# Patient Record
Sex: Female | Born: 1975 | Race: White | Hispanic: No | Marital: Married | Smoking: Former smoker
Health system: Southern US, Community
[De-identification: ages and names within clinical notes are randomized; demographics above are authoritative.]

## PROBLEM LIST (undated history)

## (undated) DIAGNOSIS — K219 Gastro-esophageal reflux disease without esophagitis: Secondary | ICD-10-CM

## (undated) DIAGNOSIS — F988 Other specified behavioral and emotional disorders with onset usually occurring in childhood and adolescence: Secondary | ICD-10-CM

## (undated) DIAGNOSIS — E079 Disorder of thyroid, unspecified: Secondary | ICD-10-CM

## (undated) HISTORY — PX: ABDOMINAL HYSTERECTOMY: SHX81

## (undated) HISTORY — DX: Disorder of thyroid, unspecified: E07.9

## (undated) HISTORY — DX: Other specified behavioral and emotional disorders with onset usually occurring in childhood and adolescence: F98.8

---
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
## 2010-09-26 LAB — HM PAP SMEAR

## 2013-01-29 ENCOUNTER — Ambulatory Visit (INDEPENDENT_AMBULATORY_CARE_PROVIDER_SITE_OTHER): Payer: BC Managed Care – PPO | Admitting: Family Medicine

## 2013-01-29 ENCOUNTER — Encounter: Payer: Self-pay | Admitting: Family Medicine

## 2013-01-29 VITALS — BP 108/62 | HR 96 | Temp 98.6°F | Ht 65.0 in | Wt 166.8 lb

## 2013-01-29 DIAGNOSIS — F988 Other specified behavioral and emotional disorders with onset usually occurring in childhood and adolescence: Secondary | ICD-10-CM

## 2013-01-29 MED ORDER — LISDEXAMFETAMINE DIMESYLATE 40 MG PO CAPS: 40.0000 mg | ORAL_CAPSULE | ORAL | Status: DC

## 2013-01-29 NOTE — Progress Notes (Signed)
  Subjective:    Patient ID: Nicole Perry, female    DOB: 07-28-1976, 37 y.o.   MRN: [id number]  HPI Pt here to establish and start back on her ADD.  She had been on Vyvanse 30 mg stopped it because her mother was dx with MS and her GF because sick and passed away.  She started back to school and started vyvanse again but has transferred here.      Review of Systems As above    Past Medical History  Diagnosis Date  . ADD (attention deficit disorder)    History   Social History  . Marital Status: Married    Spouse Name: N/A    Number of Children: N/A  . Years of Education: N/A   Occupational History  . Not on file.   Social History Main Topics  . Smoking status: Never Smoker   . Smokeless tobacco: Never Used  . Alcohol Use: Yes     Comment: Socially  . Drug Use: No  . Sexually Active: Not on file   Other Topics Concern  . Not on file   Social History Narrative  . No narrative on file   Past Surgical History  Procedure Laterality Date  . Appendectomy  1989   No current outpatient prescriptions on file prior to visit.   No current facility-administered medications on file prior to visit.    Objective:   Physical Exam  BP 108/62  Pulse 96  Temp(Src) 98.6 F (37 C) (Oral)  Ht 5\' 5"  (1.651 m)  Wt 166 lb 12.8 oz (75.66 kg)  BMI 27.76 kg/m2  SpO2 96% General appearance: alert, cooperative, appears stated age and no distress Lungs: clear to auscultation bilaterally Heart: S1, S2 normal Extremities: extremities normal, atraumatic, no cyanosis or edema       Assessment & Plan:

## 2013-01-29 NOTE — Assessment & Plan Note (Signed)
vyvanse 40 mg 1 po qd  rto 1 month

## 2013-01-29 NOTE — Patient Instructions (Signed)
Attention Deficit Hyperactivity Disorder Attention deficit hyperactivity disorder (ADHD) is a problem with behavior issues based on the way the brain functions (neurobehavioral disorder). It is a common reason for behavior and academic problems in school. CAUSES  The cause of ADHD is unknown in most cases. It may run in families. It sometimes can be associated with learning disabilities and other behavioral problems. SYMPTOMS  There are 3 types of ADHD. The 3 types and some of the symptoms include:  Inattentive  Gets bored or distracted easily.  Loses or forgets things. Forgets to hand in homework.  Has trouble organizing or completing tasks.  Difficulty staying on task.  An inability to organize daily tasks and school work.  Leaving projects, chores, or homework unfinished.  Trouble paying attention or responding to details. Careless mistakes.  Difficulty following directions. Often seems like is not listening.  Dislikes activities that require sustained attention (like chores or homework).  Hyperactive-impulsive  Feels like it is impossible to sit still or stay in a seat. Fidgeting with hands and feet.  Trouble waiting turn.  Talking too much or out of turn. Interruptive.  Speaks or acts impulsively.  Aggressive, disruptive behavior.  Constantly busy or on the go, noisy.  Combined  Has symptoms of both of the above. Often children with ADHD feel discouraged about themselves and with school. They often perform well below their abilities in school. These symptoms can cause problems in home, school, and in relationships with peers. As children get older, the excess motor activities can calm down, but the problems with paying attention and staying organized persist. Most children do not outgrow ADHD but with good treatment can learn to cope with the symptoms. DIAGNOSIS  When ADHD is suspected, the diagnosis should be made by professionals trained in ADHD.  Diagnosis will  include:  Ruling out other reasons for the child's behavior.  The caregivers will check with the child's school and check their medical records.  They will talk to teachers and parents.  Behavior rating scales for the child will be filled out by those dealing with the child on a daily basis. A diagnosis is made only after all information has been considered. TREATMENT  Treatment usually includes behavioral treatment often along with medicines. It may include stimulant medicines. The stimulant medicines decrease impulsivity and hyperactivity and increase attention. Other medicines used include antidepressants and certain blood pressure medicines. Most experts agree that treatment for ADHD should address all aspects of the child's functioning. Treatment should not be limited to the use of medicines alone. Treatment should include structured classroom management. The parents must receive education to address rewarding good behavior, discipline, and limit-setting. Tutoring or behavioral therapy or both should be available for the child. If untreated, the disorder can have long-term serious effects into adolescence and adulthood. HOME CARE INSTRUCTIONS   Often with ADHD there is a lot of frustration among the family in dealing with the illness. There is often blame and anger that is not warranted. This is a life long illness. There is no way to prevent ADHD. In many cases, because the problem affects the family as a whole, the entire family may need help. A therapist can help the family find better ways to handle the disruptive behaviors and promote change. If the child is young, most of the therapist's work is with the parents. Parents will learn techniques for coping with and improving their child's behavior. Sometimes only the child with the ADHD needs counseling. Your caregivers can help   you make these decisions.  Children with ADHD may need help in organizing. Some helpful tips include:  Keep  routines the same every day from wake-up time to bedtime. Schedule everything. This includes homework and playtime. This should include outdoor and indoor recreation. Keep the schedule on the refrigerator or a bulletin board where it is frequently seen. Mark schedule changes as far in advance as possible.  Have a place for everything and keep everything in its place. This includes clothing, backpacks, and school supplies.  Encourage writing down assignments and bringing home needed books.  Offer your child a well-balanced diet. Breakfast is especially important for school performance. Children should avoid drinks with caffeine including:  Soft drinks.  Coffee.  Tea.  However, some older children (adolescents) may find these drinks helpful in improving their attention.  Children with ADHD need consistent rules that they can understand and follow. If rules are followed, give small rewards. Children with ADHD often receive, and expect, criticism. Look for good behavior and praise it. Set realistic goals. Give clear instructions. Look for activities that can foster success and self-esteem. Make time for pleasant activities with your child. Give lots of affection.  Parents are their children's greatest advocates. Learn as much as possible about ADHD. This helps you become a stronger and better advocate for your child. It also helps you educate your child's teachers and instructors if they feel inadequate in these areas. Parent support groups are often helpful. A national group with local chapters is called CHADD (Children and Adults with Attention Deficit Hyperactivity Disorder). PROGNOSIS  There is no cure for ADHD. Children with the disorder seldom outgrow it. Many find adaptive ways to accommodate the ADHD as they mature. SEEK MEDICAL CARE IF:  Your child has repeated muscle twitches, cough or speech outbursts.  Your child has sleep problems.  Your child has a marked loss of  appetite.  Your child develops depression.  Your child has new or worsening behavioral problems.  Your child develops dizziness.  Your child has a racing heart.  Your child has stomach pains.  Your child develops headaches. Document Released: 09/02/2002 Document Revised: 12/05/2011 Document Reviewed: 04/14/2008 [employer] Patient Information 2013 [employer].  

## 2013-03-14 ENCOUNTER — Telehealth: Payer: Self-pay | Admitting: Family Medicine

## 2013-03-14 DIAGNOSIS — F988 Other specified behavioral and emotional disorders with onset usually occurring in childhood and adolescence: Secondary | ICD-10-CM

## 2013-03-14 MED ORDER — LISDEXAMFETAMINE DIMESYLATE 40 MG PO CAPS: 40.0000 mg | ORAL_CAPSULE | ORAL | Status: DC

## 2013-03-14 NOTE — Telephone Encounter (Signed)
Patient aware Rx ready for pick up after lunch.      KP 

## 2013-03-14 NOTE — Telephone Encounter (Signed)
Pt would like a refill on her lisdexamfetamine (VYVANSE) 40 MG capsule. thanks

## 2013-05-09 ENCOUNTER — Encounter: Payer: Self-pay | Admitting: Lab

## 2013-05-10 ENCOUNTER — Encounter: Payer: Self-pay | Admitting: Family Medicine

## 2013-05-10 ENCOUNTER — Ambulatory Visit (INDEPENDENT_AMBULATORY_CARE_PROVIDER_SITE_OTHER): Payer: BC Managed Care – PPO | Admitting: Family Medicine

## 2013-05-10 VITALS — BP 100/68 | HR 80 | Temp 98.5°F | Ht 66.0 in | Wt 167.8 lb

## 2013-05-10 DIAGNOSIS — D229 Melanocytic nevi, unspecified: Secondary | ICD-10-CM

## 2013-05-10 DIAGNOSIS — Z0000 Encounter for general adult medical examination without abnormal findings: Secondary | ICD-10-CM

## 2013-05-10 DIAGNOSIS — N898 Other specified noninflammatory disorders of vagina: Secondary | ICD-10-CM

## 2013-05-10 DIAGNOSIS — N939 Abnormal uterine and vaginal bleeding, unspecified: Secondary | ICD-10-CM | POA: Insufficient documentation

## 2013-05-10 DIAGNOSIS — D239 Other benign neoplasm of skin, unspecified: Secondary | ICD-10-CM

## 2013-05-10 NOTE — Assessment & Plan Note (Signed)
Refer to gyn 

## 2013-05-10 NOTE — Patient Instructions (Signed)
Preventive Care for Adults, Female A healthy lifestyle and preventive care can promote health and wellness. Preventive health guidelines for women include the following key practices.  A routine yearly physical is a good way to check with your caregiver about your health and preventive screening. It is a chance to share any concerns and updates on your health, and to receive a thorough exam.  Visit your dentist for a routine exam and preventive care every 6 months. Brush your teeth twice a day and floss once a day. Good oral hygiene prevents tooth decay and gum disease.  The frequency of eye exams is based on your age, health, family medical history, use of contact lenses, and other factors. Follow your caregiver's recommendations for frequency of eye exams.  Eat a healthy diet. Foods like vegetables, fruits, whole grains, low-fat dairy products, and lean protein foods contain the nutrients you need without too many calories. Decrease your intake of foods high in solid fats, added sugars, and salt. Eat the right amount of calories for you.Get information about a proper diet from your caregiver, if necessary.  Regular physical exercise is one of the most important things you can do for your health. Most adults should get at least 150 minutes of moderate-intensity exercise (any activity that increases your heart rate and causes you to sweat) each week. In addition, most adults need muscle-strengthening exercises on 2 or more days a week.  Maintain a healthy weight. The body mass index (BMI) is a screening tool to identify possible weight problems. It provides an estimate of body fat based on height and weight. Your caregiver can help determine your BMI, and can help you achieve or maintain a healthy weight.For adults 20 years and older:  A BMI below 18.5 is considered underweight.  A BMI of 18.5 to 24.9 is normal.  A BMI of 25 to 29.9 is considered overweight.  A BMI of 30 and above is  considered obese.  Maintain normal blood lipids and cholesterol levels by exercising and minimizing your intake of saturated fat. Eat a balanced diet with plenty of fruit and vegetables. Blood tests for lipids and cholesterol should begin at age 20 and be repeated every 5 years. If your lipid or cholesterol levels are high, you are over 50, or you are at high risk for heart disease, you may need your cholesterol levels checked more frequently.Ongoing high lipid and cholesterol levels should be treated with medicines if diet and exercise are not effective.  If you smoke, find out from your caregiver how to quit. If you do not use tobacco, do not start.  If you are pregnant, do not drink alcohol. If you are breastfeeding, be very cautious about drinking alcohol. If you are not pregnant and choose to drink alcohol, do not exceed 1 drink per day. One drink is considered to be 12 ounces (355 mL) of beer, 5 ounces (148 mL) of wine, or 1.5 ounces (44 mL) of liquor.  Avoid use of street drugs. Do not share needles with anyone. Ask for help if you need support or instructions about stopping the use of drugs.  High blood pressure causes heart disease and increases the risk of stroke. Your blood pressure should be checked at least every 1 to 2 years. Ongoing high blood pressure should be treated with medicines if weight loss and exercise are not effective.  If you are 55 to 39 years old, ask your caregiver if you should take aspirin to prevent strokes.  Diabetes   screening involves taking a blood sample to check your fasting blood sugar level. This should be done once every 3 years, after age 45, if you are within normal weight and without risk factors for diabetes. Testing should be considered at a younger age or be carried out more frequently if you are overweight and have at least 1 risk factor for diabetes.  Breast cancer screening is essential preventive care for women. You should practice "breast  self-awareness." This means understanding the normal appearance and feel of your breasts and may include breast self-examination. Any changes detected, no matter how small, should be reported to a caregiver. Women in their 20s and 30s should have a clinical breast exam (CBE) by a caregiver as part of a regular health exam every 1 to 3 years. After age 40, women should have a CBE every year. Starting at age 40, women should consider having a mammography (breast X-ray test) every year. Women who have a family history of breast cancer should talk to their caregiver about genetic screening. Women at a high risk of breast cancer should talk to their caregivers about having magnetic resonance imaging (MRI) and a mammography every year.  The Pap test is a screening test for cervical cancer. A Pap test can show cell changes on the cervix that might become cervical cancer if left untreated. A Pap test is a procedure in which cells are obtained and examined from the lower end of the uterus (cervix).  Women should have a Pap test starting at age 21.  Between ages 21 and 29, Pap tests should be repeated every 2 years.  Beginning at age 30, you should have a Pap test every 3 years as long as the past 3 Pap tests have been normal.  Some women have medical problems that increase the chance of getting cervical cancer. Talk to your caregiver about these problems. It is especially important to talk to your caregiver if a new problem develops soon after your last Pap test. In these cases, your caregiver may recommend more frequent screening and Pap tests.  The above recommendations are the same for women who have or have not gotten the vaccine for human papillomavirus (HPV).  If you had a hysterectomy for a problem that was not cancer or a condition that could lead to cancer, then you no longer need Pap tests. Even if you no longer need a Pap test, a regular exam is a good idea to make sure no other problems are  starting.  If you are between ages 65 and 70, and you have had normal Pap tests going back 10 years, you no longer need Pap tests. Even if you no longer need a Pap test, a regular exam is a good idea to make sure no other problems are starting.  If you have had past treatment for cervical cancer or a condition that could lead to cancer, you need Pap tests and screening for cancer for at least 20 years after your treatment.  If Pap tests have been discontinued, risk factors (such as a new sexual partner) need to be reassessed to determine if screening should be resumed.  The HPV test is an additional test that may be used for cervical cancer screening. The HPV test looks for the virus that can cause the cell changes on the cervix. The cells collected during the Pap test can be tested for HPV. The HPV test could be used to screen women aged 30 years and older, and should   be used in women of any age who have unclear Pap test results. After the age of 30, women should have HPV testing at the same frequency as a Pap test.  Colorectal cancer can be detected and often prevented. Most routine colorectal cancer screening begins at the age of 50 and continues through age 75. However, your caregiver may recommend screening at an earlier age if you have risk factors for colon cancer. On a yearly basis, your caregiver may provide home test kits to check for hidden blood in the stool. Use of a small camera at the end of a tube, to directly examine the colon (sigmoidoscopy or colonoscopy), can detect the earliest forms of colorectal cancer. Talk to your caregiver about this at age 50, when routine screening begins. Direct examination of the colon should be repeated every 5 to 10 years through age 75, unless early forms of pre-cancerous polyps or small growths are found.  Hepatitis C blood testing is recommended for all people born from 1945 through 1965 and any individual with known risks for hepatitis C.  Practice  safe sex. Use condoms and avoid high-risk sexual practices to reduce the spread of sexually transmitted infections (STIs). STIs include gonorrhea, chlamydia, syphilis, trichomonas, herpes, HPV, and human immunodeficiency virus (HIV). Herpes, HIV, and HPV are viral illnesses that have no cure. They can result in disability, cancer, and death. Sexually active women aged 25 and younger should be checked for chlamydia. Older women with new or multiple partners should also be tested for chlamydia. Testing for other STIs is recommended if you are sexually active and at increased risk.  Osteoporosis is a disease in which the bones lose minerals and strength with aging. This can result in serious bone fractures. The risk of osteoporosis can be identified using a bone density scan. Women ages 65 and over and women at risk for fractures or osteoporosis should discuss screening with their caregivers. Ask your caregiver whether you should take a calcium supplement or vitamin D to reduce the rate of osteoporosis.  Menopause can be associated with physical symptoms and risks. Hormone replacement therapy is available to decrease symptoms and risks. You should talk to your caregiver about whether hormone replacement therapy is right for you.  Use sunscreen with sun protection factor (SPF) of 30 or more. Apply sunscreen liberally and repeatedly throughout the day. You should seek shade when your shadow is shorter than you. Protect yourself by wearing long sleeves, pants, a wide-brimmed hat, and sunglasses year round, whenever you are outdoors.  Once a month, do a whole body skin exam, using a mirror to look at the skin on your back. Notify your caregiver of new moles, moles that have irregular borders, moles that are larger than a pencil eraser, or moles that have changed in shape or color.  Stay current with required immunizations.  Influenza. You need a dose every fall (or winter). The composition of the flu vaccine  changes each year, so being vaccinated once is not enough.  Pneumococcal polysaccharide. You need 1 to 2 doses if you smoke cigarettes or if you have certain chronic medical conditions. You need 1 dose at age 65 (or older) if you have never been vaccinated.  Tetanus, diphtheria, pertussis (Tdap, Td). Get 1 dose of Tdap vaccine if you are younger than age 65, are over 65 and have contact with an infant, are a healthcare worker, are pregnant, or simply want to be protected from whooping cough. After that, you need a Td   booster dose every 10 years. Consult your caregiver if you have not had at least 3 tetanus and diphtheria-containing shots sometime in your life or have a deep or dirty wound.  HPV. You need this vaccine if you are a woman age 26 or younger. The vaccine is given in 3 doses over 6 months.  Measles, mumps, rubella (MMR). You need at least 1 dose of MMR if you were born in 1957 or later. You may also need a second dose.  Meningococcal. If you are age 19 to 21 and a first-year college student living in a residence hall, or have one of several medical conditions, you need to get vaccinated against meningococcal disease. You may also need additional booster doses.  Zoster (shingles). If you are age 60 or older, you should get this vaccine.  Varicella (chickenpox). If you have never had chickenpox or you were vaccinated but received only 1 dose, talk to your caregiver to find out if you need this vaccine.  Hepatitis A. You need this vaccine if you have a specific risk factor for hepatitis A virus infection or you simply wish to be protected from this disease. The vaccine is usually given as 2 doses, 6 to 18 months apart.  Hepatitis B. You need this vaccine if you have a specific risk factor for hepatitis B virus infection or you simply wish to be protected from this disease. The vaccine is given in 3 doses, usually over 6 months. Preventive Services / Frequency Ages 19 to 39  Blood  pressure check.** / Every 1 to 2 years.  Lipid and cholesterol check.** / Every 5 years beginning at age 20.  Clinical breast exam.** / Every 3 years for women in their 20s and 30s.  Pap test.** / Every 2 years from ages 21 through 29. Every 3 years starting at age 30 through age 65 or 70 with a history of 3 consecutive normal Pap tests.  HPV screening.** / Every 3 years from ages 30 through ages 65 to 70 with a history of 3 consecutive normal Pap tests.  Hepatitis C blood test.** / For any individual with known risks for hepatitis C.  Skin self-exam. / Monthly.  Influenza immunization.** / Every year.  Pneumococcal polysaccharide immunization.** / 1 to 2 doses if you smoke cigarettes or if you have certain chronic medical conditions.  Tetanus, diphtheria, pertussis (Tdap, Td) immunization. / A one-time dose of Tdap vaccine. After that, you need a Td booster dose every 10 years.  HPV immunization. / 3 doses over 6 months, if you are 26 and younger.  Measles, mumps, rubella (MMR) immunization. / You need at least 1 dose of MMR if you were born in 1957 or later. You may also need a second dose.  Meningococcal immunization. / 1 dose if you are age 19 to 21 and a first-year college student living in a residence hall, or have one of several medical conditions, you need to get vaccinated against meningococcal disease. You may also need additional booster doses.  Varicella immunization.** / Consult your caregiver.  Hepatitis A immunization.** / Consult your caregiver. 2 doses, 6 to 18 months apart.  Hepatitis B immunization.** / Consult your caregiver. 3 doses usually over 6 months. Ages 40 to 64  Blood pressure check.** / Every 1 to 2 years.  Lipid and cholesterol check.** / Every 5 years beginning at age 20.  Clinical breast exam.** / Every year after age 40.  Mammogram.** / Every year beginning at age 40   and continuing for as long as you are in good health. Consult with your  caregiver.  Pap test.** / Every 3 years starting at age 30 through age 65 or 70 with a history of 3 consecutive normal Pap tests.  HPV screening.** / Every 3 years from ages 30 through ages 65 to 70 with a history of 3 consecutive normal Pap tests.  Fecal occult blood test (FOBT) of stool. / Every year beginning at age 50 and continuing until age 75. You may not need to do this test if you get a colonoscopy every 10 years.  Flexible sigmoidoscopy or colonoscopy.** / Every 5 years for a flexible sigmoidoscopy or every 10 years for a colonoscopy beginning at age 50 and continuing until age 75.  Hepatitis C blood test.** / For all people born from 1945 through 1965 and any individual with known risks for hepatitis C.  Skin self-exam. / Monthly.  Influenza immunization.** / Every year.  Pneumococcal polysaccharide immunization.** / 1 to 2 doses if you smoke cigarettes or if you have certain chronic medical conditions.  Tetanus, diphtheria, pertussis (Tdap, Td) immunization.** / A one-time dose of Tdap vaccine. After that, you need a Td booster dose every 10 years.  Measles, mumps, rubella (MMR) immunization. / You need at least 1 dose of MMR if you were born in 1957 or later. You may also need a second dose.  Varicella immunization.** / Consult your caregiver.  Meningococcal immunization.** / Consult your caregiver.  Hepatitis A immunization.** / Consult your caregiver. 2 doses, 6 to 18 months apart.  Hepatitis B immunization.** / Consult your caregiver. 3 doses, usually over 6 months. Ages 65 and over  Blood pressure check.** / Every 1 to 2 years.  Lipid and cholesterol check.** / Every 5 years beginning at age 20.  Clinical breast exam.** / Every year after age 40.  Mammogram.** / Every year beginning at age 40 and continuing for as long as you are in good health. Consult with your caregiver.  Pap test.** / Every 3 years starting at age 30 through age 65 or 70 with a 3  consecutive normal Pap tests. Testing can be stopped between 65 and 70 with 3 consecutive normal Pap tests and no abnormal Pap or HPV tests in the past 10 years.  HPV screening.** / Every 3 years from ages 30 through ages 65 or 70 with a history of 3 consecutive normal Pap tests. Testing can be stopped between 65 and 70 with 3 consecutive normal Pap tests and no abnormal Pap or HPV tests in the past 10 years.  Fecal occult blood test (FOBT) of stool. / Every year beginning at age 50 and continuing until age 75. You may not need to do this test if you get a colonoscopy every 10 years.  Flexible sigmoidoscopy or colonoscopy.** / Every 5 years for a flexible sigmoidoscopy or every 10 years for a colonoscopy beginning at age 50 and continuing until age 75.  Hepatitis C blood test.** / For all people born from 1945 through 1965 and any individual with known risks for hepatitis C.  Osteoporosis screening.** / A one-time screening for women ages 65 and over and women at risk for fractures or osteoporosis.  Skin self-exam. / Monthly.  Influenza immunization.** / Every year.  Pneumococcal polysaccharide immunization.** / 1 dose at age 65 (or older) if you have never been vaccinated.  Tetanus, diphtheria, pertussis (Tdap, Td) immunization. / A one-time dose of Tdap vaccine if you are over   65 and have contact with an infant, are a healthcare worker, or simply want to be protected from whooping cough. After that, you need a Td booster dose every 10 years.  Varicella immunization.** / Consult your caregiver.  Meningococcal immunization.** / Consult your caregiver.  Hepatitis A immunization.** / Consult your caregiver. 2 doses, 6 to 18 months apart.  Hepatitis B immunization.** / Check with your caregiver. 3 doses, usually over 6 months. ** Family history and personal history of risk and conditions may change your caregiver's recommendations. Document Released: 11/08/2001 Document Revised: 12/05/2011  Document Reviewed: 02/07/2011 [employer] Patient Information 2014 [employer].  

## 2013-05-10 NOTE — Progress Notes (Signed)
Subjective:     Nicole Perry is a 37 y.o. female and is here for a comprehensive physical exam. The patient reports vaginal bleeding.  She is requesting a new gyn.    History   Social History  . Marital Status: Married    Spouse Name: N/A    Number of Children: N/A  . Years of Education: N/A   Occupational History  . Not on file.   Social History Main Topics  . Smoking status: Never Smoker   . Smokeless tobacco: Never Used  . Alcohol Use: Yes     Comment: Socially  . Drug Use: No  . Sexual Activity: Not on file   Other Topics Concern  . Not on file   Social History Narrative  . No narrative on file   Health Maintenance  Topic Date Due  . Influenza Vaccine  05/27/2013  . Pap Smear  09/26/2013  . Tetanus/tdap  01/30/2020    The following portions of the patient's history were reviewed and updated as appropriate:  She  has a past medical history of ADD (attention deficit disorder). She  does not have any pertinent problems on file. She  has past surgical history that includes Appendectomy (1989). Her family history includes Breast cancer in her maternal grandmother; Diabetes in her maternal grandmother; Heart disease in her maternal grandfather; High blood pressure in her maternal grandfather; Multiple sclerosis in her mother; Other in her maternal grandmother; Prostate cancer in her maternal uncle. She  reports that she has never smoked. She has never used smokeless tobacco. She reports that  drinks alcohol. She reports that she does not use illicit drugs. She has a current medication list which includes the following prescription(s): lisdexamfetamine. Current Outpatient Prescriptions on File Prior to Visit  Medication Sig Dispense Refill  . lisdexamfetamine (VYVANSE) 40 MG capsule Take 1 capsule (40 mg total) by mouth every morning.  30 capsule  0   No current facility-administered medications on file prior to visit.   She is allergic to penicillins..  Review of  Systems Review of Systems  Constitutional: Negative for activity change, appetite change and fatigue.  HENT: Negative for hearing loss, congestion, tinnitus and ear discharge.  dentist q73m Eyes: Negative for visual disturbance (see optho q1y -- vision corrected to 20/20 with glasses).  Respiratory: Negative for cough, chest tightness and shortness of breath.   Cardiovascular: Negative for chest pain, palpitations and leg swelling.  Gastrointestinal: Negative for abdominal pain, diarrhea, constipation and abdominal distention.  Genitourinary: Negative for urgency, frequency, decreased urine volume and difficulty urinating.  Musculoskeletal: Negative for back pain, arthralgias and gait problem.  Skin: Negative for color change, pallor and rash.  Neurological: Negative for dizziness, light-headedness, numbness and headaches.  Hematological: Negative for adenopathy. Does not bruise/bleed easily.  Psychiatric/Behavioral: Negative for suicidal ideas, confusion, sleep disturbance, self-injury, dysphoric mood, decreased concentration and agitation.       Objective:    BP 100/68  Pulse 80  Temp(Src) 98.5 F (36.9 C) (Oral)  Ht 5\' 6"  (1.676 m)  Wt 167 lb 12.8 oz (76.114 kg)  BMI 27.1 kg/m2  SpO2 99% General appearance: alert, cooperative, appears stated age and no distress Head: Normocephalic, without obvious abnormality, atraumatic Eyes: conjunctivae/corneas clear. PERRL, EOM's intact. Fundi benign. Ears: normal TM's and external ear canals both ears Nose: Nares normal. Septum midline. Mucosa normal. No drainage or sinus tenderness. Throat: lips, mucosa, and tongue normal; teeth and gums normal Neck: no adenopathy, no carotid bruit, no JVD, supple,  symmetrical, trachea midline and thyroid not enlarged, symmetric, no tenderness/mass/nodules Back: symmetric, no curvature. ROM normal. No CVA tenderness. Lungs: clear to auscultation bilaterally Breasts: gyn Heart: regular rate and rhythm,  S1, S2 normal, no murmur, click, rub or gallop Abdomen: soft, non-tender; bowel sounds normal; no masses,  no organomegaly Pelvic: deferred --gyn Extremities: extremities normal, atraumatic, no cyanosis or edema Pulses: 2+ and symmetric Skin: + multiple suspicious moles Lymph nodes: Cervical, supraclavicular, and axillary nodes normal. Neurologic: Alert and oriented X 3, normal strength and tone. Normal symmetric reflexes. Normal coordination and gait Psych--no anxiety, no depression      Assessment:    Healthy female exam.      Plan:    ghm utd  Check labs See After Visit Summary for Counseling Recommendations

## 2013-05-13 ENCOUNTER — Other Ambulatory Visit: Payer: BC Managed Care – PPO

## 2013-05-20 ENCOUNTER — Other Ambulatory Visit: Payer: BC Managed Care – PPO

## 2013-05-24 ENCOUNTER — Other Ambulatory Visit (INDEPENDENT_AMBULATORY_CARE_PROVIDER_SITE_OTHER): Payer: BC Managed Care – PPO

## 2013-05-24 DIAGNOSIS — Z0000 Encounter for general adult medical examination without abnormal findings: Secondary | ICD-10-CM

## 2013-05-24 LAB — POCT URINALYSIS DIPSTICK
Bilirubin, UA: NEGATIVE
Blood, UA: NEGATIVE
Glucose, UA: NEGATIVE
Ketones, UA: NEGATIVE
Leukocytes, UA: NEGATIVE
Nitrite, UA: NEGATIVE
Protein, UA: NEGATIVE
Spec Grav, UA: 1.01
Urobilinogen, UA: 1
pH, UA: 7.5

## 2013-05-24 LAB — BASIC METABOLIC PANEL
BUN: 12 mg/dL (ref 6–23)
CO2: 28 mEq/L (ref 19–32)
Calcium: 8.7 mg/dL (ref 8.4–10.5)
Chloride: 105 mEq/L (ref 96–112)
Creatinine, Ser: 0.5 mg/dL (ref 0.4–1.2)
GFR: 142.63 mL/min (ref >60.00)
Glucose, Bld: 82 mg/dL (ref 70–99)
Potassium: 3.5 mEq/L (ref 3.5–5.1)
Sodium: 137 mEq/L (ref 135–145)

## 2013-05-24 LAB — CBC WITH DIFFERENTIAL/PLATELET
Basophils Absolute: 0 10*3/uL (ref 0.0–0.1)
Basophils Relative: 0.6 % (ref 0.0–3.0)
Eosinophils Absolute: 0.1 10*3/uL (ref 0.0–0.7)
Eosinophils Relative: 1.5 % (ref 0.0–5.0)
HCT: 38.9 % (ref 36.0–46.0)
Hemoglobin: 13.4 g/dL (ref 12.0–15.0)
Lymphocytes Relative: 28.5 % (ref 12.0–46.0)
Lymphs Abs: 1.4 10*3/uL (ref 0.7–4.0)
MCHC: 34.5 g/dL (ref 30.0–36.0)
MCV: 92.2 fl (ref 78.0–100.0)
Monocytes Absolute: 0.3 10*3/uL (ref 0.1–1.0)
Monocytes Relative: 6.5 % (ref 3.0–12.0)
Neutro Abs: 3 10*3/uL (ref 1.4–7.7)
Neutrophils Relative %: 62.9 % (ref 43.0–77.0)
Platelets: 241 10*3/uL (ref 150.0–400.0)
RBC: 4.22 Mil/uL (ref 3.87–5.11)
RDW: 12.3 % (ref 11.5–14.6)
WBC: 4.8 10*3/uL (ref 4.5–10.5)

## 2013-05-24 LAB — LIPID PANEL
Cholesterol: 161 mg/dL (ref 0–200)
HDL: 61.4 mg/dL (ref >39.00)
LDL Cholesterol: 90 mg/dL (ref 0–99)
Total CHOL/HDL Ratio: 3
Triglycerides: 47 mg/dL (ref 0.0–149.0)
VLDL: 9.4 mg/dL (ref 0.0–40.0)

## 2013-05-24 LAB — HEPATIC FUNCTION PANEL
ALT: 14 U/L (ref 0–35)
AST: 14 U/L (ref 0–37)
Albumin: 4.1 g/dL (ref 3.5–5.2)
Alkaline Phosphatase: 50 U/L (ref 39–117)
Bilirubin, Direct: 0 mg/dL (ref 0.0–0.3)
Total Bilirubin: 0.5 mg/dL (ref 0.3–1.2)
Total Protein: 6.7 g/dL (ref 6.0–8.3)

## 2013-05-24 LAB — TSH: TSH: 2.43 u[IU]/mL (ref 0.35–5.50)

## 2013-07-04 ENCOUNTER — Encounter: Payer: Self-pay | Admitting: Family Medicine

## 2013-07-10 ENCOUNTER — Telehealth: Payer: Self-pay | Admitting: *Deleted

## 2013-07-10 DIAGNOSIS — F988 Other specified behavioral and emotional disorders with onset usually occurring in childhood and adolescence: Secondary | ICD-10-CM

## 2013-07-10 MED ORDER — LISDEXAMFETAMINE DIMESYLATE 40 MG PO CAPS: 40.0000 mg | ORAL_CAPSULE | ORAL | Status: DC

## 2013-07-10 NOTE — Telephone Encounter (Signed)
Last visit:05/23/2013  Last filled: 03/14/2013  [facility]-05/14/2013-low risk, contract signed  Please advise. SW

## 2013-07-10 NOTE — Telephone Encounter (Signed)
Patient aware Rx ready for pick up tomorrow.    KP 

## 2013-07-12 ENCOUNTER — Encounter: Payer: Self-pay | Admitting: Family Medicine

## 2013-07-12 ENCOUNTER — Ambulatory Visit (INDEPENDENT_AMBULATORY_CARE_PROVIDER_SITE_OTHER): Payer: BC Managed Care – PPO | Admitting: Family Medicine

## 2013-07-12 VITALS — BP 108/68 | HR 85 | Temp 99.1°F | Wt 166.0 lb

## 2013-07-12 DIAGNOSIS — Z23 Encounter for immunization: Secondary | ICD-10-CM

## 2013-07-12 DIAGNOSIS — R1013 Epigastric pain: Secondary | ICD-10-CM

## 2013-07-12 DIAGNOSIS — K3189 Other diseases of stomach and duodenum: Secondary | ICD-10-CM

## 2013-07-12 DIAGNOSIS — F988 Other specified behavioral and emotional disorders with onset usually occurring in childhood and adolescence: Secondary | ICD-10-CM

## 2013-07-12 NOTE — Progress Notes (Signed)
  Subjective:     Nicole Perry is an 37 y.o. female who presents for evaluation of heartburn. This has been associated with heartburn. She denies abdominal bloating, belching, belching and eructation, bilious reflux, chest pain, choking on food, cough, deep pressure at base of neck, difficulty swallowing, dysphagia, early satiety, fullness after meals, hematemesis, hoarseness, laryngitis, melena, midespigastric pain, nausea, need to clear throat frequently, nocturnal burning, odynophagia, regurgitation of undigested food, shortness of breath, symptoms primarily relate to meals, and lying down after meals, unexpected weight loss, upper abdominal discomfort, waterbrash and wheezing. Symptoms have been present for several years. She denies dysphagia. She has not lost weight. She denies melena, hematochezia, hematemesis, and coffee ground emesis. Medical therapy in the past has included: antacids, H2 antagonists and proton pump inhibitors.  Pt has no symptoms now but her daughter was dx with celiac and she would like to be tested for celiac.     The following portions of the patient's history were reviewed and updated as appropriate: allergies, current medications, past family history, past medical history, past social history, past surgical history and problem list.  Review of Systems Pertinent items are noted in HPI.   Objective:     BP 108/68  Pulse 85  Temp(Src) 99.1 F (37.3 C) (Oral)  Wt 166 lb (75.297 kg)  BMI 26.81 kg/m2  SpO2 96% General appearance: alert, cooperative, appears stated age and no distress Abdomen: soft, non-tender; bowel sounds normal; no masses,  no organomegaly   Assessment:    Gastroesophageal Reflux Disease,      Plan:    Nonpharmacologic treatments were discussed including: eating smaller meals, elevation of the head of bed at night, avoidance of caffeine, chocolate, nicotine and peppermint, and avoiding tight fitting clothing. check labs and f/u prn

## 2013-07-12 NOTE — Patient Instructions (Signed)

## 2013-07-15 LAB — GLIADIN ANTIBODIES, SERUM
Gliadin IgA: 35.6 U/mL — ABNORMAL HIGH (ref <20)
Gliadin IgG: 6.2 U/mL (ref <20)

## 2013-07-15 LAB — TISSUE TRANSGLUTAMINASE, IGA: Tissue Transglutaminase Ab, IgA: 4.7 U/mL (ref <20)

## 2013-07-16 ENCOUNTER — Telehealth: Payer: Self-pay | Admitting: *Deleted

## 2013-07-16 LAB — RETICULIN ANTIBODIES, IGA W TITER: Reticulin Ab, IgA: NEGATIVE

## 2013-07-16 NOTE — Telephone Encounter (Signed)
Patient called and wanted results of her tests that she got done on 07/12/2013. Results have not yet been resulted. Made patient aware that as soon as the lab results the lab she will be notified.

## 2013-07-19 ENCOUNTER — Telehealth: Payer: Self-pay | Admitting: Family Medicine

## 2013-07-19 DIAGNOSIS — R1013 Epigastric pain: Secondary | ICD-10-CM

## 2013-07-19 NOTE — Telephone Encounter (Signed)
Patient states that she received her results on MyChart but has questions about them. Please call to advise.

## 2013-07-22 NOTE — Telephone Encounter (Signed)
Patient is requesting a referral to GI.     KP

## 2013-07-30 ENCOUNTER — Encounter: Payer: Self-pay | Admitting: Internal Medicine

## 2013-08-16 ENCOUNTER — Encounter (HOSPITAL_COMMUNITY): Payer: Self-pay | Admitting: Pharmacist

## 2013-08-20 ENCOUNTER — Encounter (HOSPITAL_COMMUNITY)
Admission: RE | Admit: 2013-08-20 | Discharge: 2013-08-20 | Disposition: A | Discharge status: Home / Self Care | Payer: BC Managed Care – PPO | Source: Ambulatory Visit | Attending: Obstetrics & Gynecology | Admitting: Obstetrics & Gynecology

## 2013-08-20 ENCOUNTER — Encounter (HOSPITAL_COMMUNITY): Payer: Self-pay

## 2013-08-20 DIAGNOSIS — Z01818 Encounter for other preprocedural examination: Secondary | ICD-10-CM | POA: Insufficient documentation

## 2013-08-20 DIAGNOSIS — Z01812 Encounter for preprocedural laboratory examination: Secondary | ICD-10-CM | POA: Insufficient documentation

## 2013-08-20 HISTORY — DX: Gastro-esophageal reflux disease without esophagitis: K21.9

## 2013-08-20 LAB — CBC
HCT: 39.4 % (ref 36.0–46.0)
Hemoglobin: 13.5 g/dL (ref 12.0–15.0)
MCH: 30.8 pg (ref 26.0–34.0)
MCHC: 34.3 g/dL (ref 30.0–36.0)
MCV: 89.7 fL (ref 78.0–100.0)
Platelets: 238 10*3/uL (ref 150–400)
RBC: 4.39 MIL/uL (ref 3.87–5.11)
RDW: 12.2 % (ref 11.5–15.5)
WBC: 5.3 10*3/uL (ref 4.0–10.5)

## 2013-08-20 NOTE — Patient Instructions (Signed)
Your procedure is scheduled on:08/29/13  Enter through the Main Entrance at : 6am Pick up desk phone and dial [phone] and inform us of your arrival.  Please call [phone] if you have any problems the morning of surgery.  Remember: Do not eat food or drink liquids, including water, after midnight: Wed.   You may brush your teeth the morning of surgery.   DO NOT wear jewelry, eye make-up, lipstick,body lotion, or dark fingernail polish.  (Polished toes are ok) You may wear deodorant.  If you are to be admitted after surgery, leave suitcase in car until your room has been assigned. Patients discharged on the day of surgery will not be allowed to drive home. Wear loose fitting, comfortable clothes for your ride home.

## 2013-08-21 ENCOUNTER — Encounter: Payer: Self-pay | Admitting: Internal Medicine

## 2013-08-27 ENCOUNTER — Ambulatory Visit: Payer: BC Managed Care – PPO | Admitting: Internal Medicine

## 2013-09-04 ENCOUNTER — Encounter: Payer: Self-pay | Admitting: Internal Medicine

## 2013-09-04 ENCOUNTER — Ambulatory Visit (INDEPENDENT_AMBULATORY_CARE_PROVIDER_SITE_OTHER): Payer: BC Managed Care – PPO | Admitting: Internal Medicine

## 2013-09-04 VITALS — BP 111/71 | HR 88 | Temp 97.8°F | Wt 173.0 lb

## 2013-09-04 DIAGNOSIS — R059 Cough, unspecified: Secondary | ICD-10-CM

## 2013-09-04 DIAGNOSIS — R05 Cough: Secondary | ICD-10-CM

## 2013-09-04 MED ORDER — AZITHROMYCIN 250 MG PO TABS: ORAL_TABLET | ORAL | Status: DC

## 2013-09-04 MED ORDER — HYDROCODONE-HOMATROPINE 5-1.5 MG/5ML PO SYRP: 5.0000 mL | ORAL_SOLUTION | Freq: Three times a day (TID) | ORAL | Status: DC | PRN

## 2013-09-04 NOTE — Patient Instructions (Signed)
Rest, fluids , tylenol For cough, take Mucinex DM twice a day as needed  If the cough continue, take hydrocodone 3 times a day, will cause drowsiness. For congestion use OTC Nasocort: 2 nasal sprays on each side of the nose daily until you feel better Take the antibiotic as prescribed  (zithromax ) Call if no better in few days Call anytime if the symptoms are severe

## 2013-09-04 NOTE — Progress Notes (Signed)
Pre visit review using our clinic review tool, if applicable. No additional management support is needed unless otherwise documented below in the visit note. 

## 2013-09-04 NOTE — Progress Notes (Signed)
   Subjective:    Patient ID: Nicole Perry, female    DOB: [birth year], 37 y.o.   MRN: [id number]  HPI Acute visit Symptoms started a week ago, initially had sore throat, that is resolved, now has quite a persisting cough. Also chest and head congestion. Unable to sleep well. Multiple family members sick as well, see nurse's notes.  Past Medical History  Diagnosis Date  . ADD (attention deficit disorder)   . GERD (gastroesophageal reflux disease)   . Seizures     as infant   Past Surgical History  Procedure Laterality Date  . Appendectomy  1989    Review of Systems occ  fever no higher than 100.3. Mild sputum production mostly in the morning. No history of wheezing, no history of asthma. Denies nausea, vomiting or myalgias. Had a flu shot. Birth control-- husband vasectomy. Occasional headache, mostly located behind the left eye.    Objective:   Physical Exam BP 111/71  Pulse 88  Temp(Src) 97.8 F (36.6 C)  Wt 173 lb (78.472 kg)  SpO2 100%  LMP 07/30/2013 General -- alert, well-developed, NAD. Freq cough noted   HEENT-- Not pale. TMs normal, throat symmetric, no redness or discharge. Face symmetric, sinuses not tender to palpation. Nose moderately congested.  Lungs -- normal respiratory effort, no intercostal retractions, no accessory muscle use, and normal breath sounds.  Heart-- normal rate, regular rhythm, no murmur.  Neurologic--  alert & oriented X3. Speech normal, gait normal, strength normal in all extremities EOMI, PERLA   Psych-- Cognition and judgment appear intact. Cooperative with normal attention span and concentration. No anxious appearing , no depressed appearing.       Assessment & Plan:  Cough, Persisting cough, patient is concerned about pneumonia but there is no evidence of pneumonia at this time. She probably has had atypical infection. See instructions.

## 2013-09-05 ENCOUNTER — Encounter: Payer: Self-pay | Admitting: Internal Medicine

## 2013-09-27 ENCOUNTER — Encounter: Payer: Self-pay | Admitting: Internal Medicine

## 2013-09-28 ENCOUNTER — Encounter (HOSPITAL_BASED_OUTPATIENT_CLINIC_OR_DEPARTMENT_OTHER): Payer: Self-pay | Admitting: Emergency Medicine

## 2013-09-28 ENCOUNTER — Emergency Department (HOSPITAL_BASED_OUTPATIENT_CLINIC_OR_DEPARTMENT_OTHER)
Admission: EM | Admit: 2013-09-28 | Discharge: 2013-09-28 | Disposition: A | Discharge status: Home / Self Care | Payer: BC Managed Care – PPO | Attending: Emergency Medicine | Admitting: Emergency Medicine

## 2013-09-28 ENCOUNTER — Emergency Department (HOSPITAL_BASED_OUTPATIENT_CLINIC_OR_DEPARTMENT_OTHER): Payer: BC Managed Care – PPO

## 2013-09-28 DIAGNOSIS — R059 Cough, unspecified: Secondary | ICD-10-CM

## 2013-09-28 DIAGNOSIS — J3489 Other specified disorders of nose and nasal sinuses: Secondary | ICD-10-CM | POA: Insufficient documentation

## 2013-09-28 DIAGNOSIS — F988 Other specified behavioral and emotional disorders with onset usually occurring in childhood and adolescence: Secondary | ICD-10-CM | POA: Insufficient documentation

## 2013-09-28 DIAGNOSIS — Z79899 Other long term (current) drug therapy: Secondary | ICD-10-CM | POA: Insufficient documentation

## 2013-09-28 DIAGNOSIS — M546 Pain in thoracic spine: Secondary | ICD-10-CM | POA: Insufficient documentation

## 2013-09-28 DIAGNOSIS — K219 Gastro-esophageal reflux disease without esophagitis: Secondary | ICD-10-CM | POA: Insufficient documentation

## 2013-09-28 DIAGNOSIS — R05 Cough: Secondary | ICD-10-CM | POA: Insufficient documentation

## 2013-09-28 DIAGNOSIS — Z8669 Personal history of other diseases of the nervous system and sense organs: Secondary | ICD-10-CM | POA: Insufficient documentation

## 2013-09-28 DIAGNOSIS — Z880 Allergy status to penicillin: Secondary | ICD-10-CM | POA: Insufficient documentation

## 2013-09-28 DIAGNOSIS — M549 Dorsalgia, unspecified: Secondary | ICD-10-CM

## 2013-09-28 NOTE — ED Notes (Signed)
Seen by EDP Zavitz in triage- no rx given

## 2013-09-28 NOTE — ED Notes (Signed)
Pt c/o cough, chest congestion and right mid back pain x 5 days. Pt has been treated for bronchitis by PCP. Pt sts she wants to make sure she doesn't have pneumonia.

## 2013-09-28 NOTE — Discharge Instructions (Signed)
Continue motrin, tylenol and warm packs for pain. If you were given medicines take as directed.  If you are on coumadin or contraceptives realize their levels and effectiveness is altered by many different medicines.  If you have any reaction (rash, tongues swelling, other) to the medicines stop taking and see a physician.   Please follow up as directed and return to the ER or see a physician for new or worsening symptoms.  Thank you.  If worsens you may need work up for blood clots.

## 2013-09-30 NOTE — ED Provider Notes (Signed)
CSN: [id number]     Arrival date & time 09/28/13  1322 History   First MD Initiated Contact with Patient 09/28/13 1605     Chief Complaint  Patient presents with  . Cough  . Back Pain   (Consider location/radiation/quality/duration/timing/severity/associated sxs/prior Treatment) HPI Comments: 38 yo female with adhd hx presents with productive cough and congestion for 5 days, pt developed right upper back pain with coughing.  No fevers.  Pt worried about pneumonia. No blood clot hx, recent surgeries,  No leg pain/ swelling.  Pain ache, constant.  Non radiating.   Patient is a 38 y.o. female presenting with cough and back pain. The history is provided by the patient.  Cough Cough characteristics:  Productive Severity:  Mild Timing:  Intermittent Chronicity:  Recurrent Associated symptoms: no chest pain, no chills, no fever, no headaches, no rash and no shortness of breath   Back Pain Associated symptoms: no abdominal pain, no chest pain, no dysuria, no fever and no headaches     Past Medical History  Diagnosis Date  . ADD (attention deficit disorder)   . GERD (gastroesophageal reflux disease)   . Seizures     as infant   Past Surgical History  Procedure Laterality Date  . Appendectomy  1989   Family History  Problem Relation Age of Onset  . Breast cancer Maternal Grandmother   . Diabetes Maternal Grandmother   . Other Maternal Grandmother     Blood Cancer  . High blood pressure Maternal Grandfather   . Heart disease Maternal Grandfather   . Prostate cancer Maternal Uncle   . Multiple sclerosis Mother    History  Substance Use Topics  . Smoking status: Never Smoker   . Smokeless tobacco: Never Used  . Alcohol Use: No     Comment: Socially   OB History   Grav Para Term Preterm Abortions TAB SAB Ect Mult Living                 Review of Systems  Constitutional: Negative for fever and chills.  HENT: Positive for congestion.   Respiratory: Positive for cough.  Negative for shortness of breath.   Cardiovascular: Negative for chest pain.  Gastrointestinal: Negative for vomiting and abdominal pain.  Genitourinary: Negative for dysuria and flank pain.  Musculoskeletal: Positive for back pain. Negative for neck pain and neck stiffness.  Skin: Negative for rash.  Neurological: Negative for headaches.    Allergies  Penicillins  Home Medications   Current Outpatient Rx  Name  Route  Sig  Dispense  Refill  . acetaminophen (TYLENOL) 325 MG tablet   Oral   Take 650 mg by mouth every 6 (six) hours as needed (pain).         . Acetaminophen-Caff-Pyrilamine (MIDOL MAX ST MENSTRUAL PO)   Oral   Take 1 tablet by mouth every 8 (eight) hours as needed (for menstrual pain).         Marland Kitchen azithromycin (ZITHROMAX Z-PAK) 250 MG tablet      As directed   6 each   0   . HYDROcodone-homatropine (HYCODAN) 5-1.5 MG/5ML syrup   Oral   Take 5 mLs by mouth every 8 (eight) hours as needed for cough.   120 mL   0   . ibuprofen (ADVIL,MOTRIN) 200 MG tablet   Oral   Take 200 mg by mouth every 6 (six) hours as needed (for pain).         Marland Kitchen lisdexamfetamine (VYVANSE) 40 MG capsule  Oral   Take 1 capsule (40 mg total) by mouth every morning.   30 capsule   0   . omeprazole (PRILOSEC) 20 MG capsule   Oral   Take 20 mg by mouth daily as needed (heartburn).         . polyethylene glycol (MIRALAX / GLYCOLAX) packet   Oral   Take 17 g by mouth daily. One capful          BP 136/69  Pulse 78  Temp(Src) 98.7 F (37.1 C) (Oral)  Resp 18  SpO2 100%  LMP 08/28/2013 Physical Exam  Nursing note and vitals reviewed. Constitutional: She is oriented to person, place, and time. She appears well-developed and well-nourished.  HENT:  Head: Normocephalic and atraumatic.  Eyes: Conjunctivae are normal. Right eye exhibits no discharge. Left eye exhibits no discharge.  Neck: Normal range of motion. Neck supple.  Cardiovascular: Normal rate and regular  rhythm.   Pulmonary/Chest: Effort normal and breath sounds normal.  Abdominal: Soft. There is no guarding.  Musculoskeletal: She exhibits no edema and no tenderness.  Neurological: She is alert and oriented to person, place, and time.  Skin: Skin is warm. No rash noted.  Psychiatric: She has a normal mood and affect.    ED Course  Procedures (including critical care time) Labs Review Labs Reviewed - No data to display Imaging Review No results found. Dg Chest 2 View  09/28/2013   CLINICAL DATA:  COUGH BACK PAIN  EXAM: CHEST  2 VIEW  COMPARISON:  No comparisons  FINDINGS: The heart size and mediastinal contours are within normal limits. Both lungs are clear. The visualized skeletal structures are unremarkable.  IMPRESSION: No active cardiopulmonary disease.   Electronically Signed   By: Kathreen Devoid   On: 09/28/2013 14:15   EKG Interpretation   None       MDM   1. Cough   2. Upper back pain on right side    Well appearing. Lungs clear, vitals normal. Pt healthy otherwise.  Clinically bronchitis with msk strain from coughing.  CXR no acute findings, reviewed.   Results and differential diagnosis were discussed with the patient. Close follow up outpatient was discussed, patient comfortable with the plan.   Diagnosis: above    Mariea Clonts, MD 09/30/13 2330

## 2013-10-01 ENCOUNTER — Ambulatory Visit (INDEPENDENT_AMBULATORY_CARE_PROVIDER_SITE_OTHER): Payer: BC Managed Care – PPO | Admitting: Internal Medicine

## 2013-10-01 ENCOUNTER — Encounter: Payer: Self-pay | Admitting: Internal Medicine

## 2013-10-01 ENCOUNTER — Other Ambulatory Visit (INDEPENDENT_AMBULATORY_CARE_PROVIDER_SITE_OTHER): Payer: BC Managed Care – PPO

## 2013-10-01 VITALS — BP 100/64 | HR 72 | Ht 66.0 in | Wt 173.0 lb

## 2013-10-01 DIAGNOSIS — K3189 Other diseases of stomach and duodenum: Secondary | ICD-10-CM

## 2013-10-01 DIAGNOSIS — K5900 Constipation, unspecified: Secondary | ICD-10-CM

## 2013-10-01 DIAGNOSIS — R109 Unspecified abdominal pain: Secondary | ICD-10-CM

## 2013-10-01 DIAGNOSIS — R1013 Epigastric pain: Secondary | ICD-10-CM

## 2013-10-01 DIAGNOSIS — R110 Nausea: Secondary | ICD-10-CM

## 2013-10-01 DIAGNOSIS — K219 Gastro-esophageal reflux disease without esophagitis: Secondary | ICD-10-CM

## 2013-10-01 LAB — IGA: IgA: 225 mg/dL (ref 68–378)

## 2013-10-01 MED ORDER — PANTOPRAZOLE SODIUM 40 MG PO TBEC: 40.0000 mg | DELAYED_RELEASE_TABLET | Freq: Every day | ORAL | Status: DC

## 2013-10-01 NOTE — Patient Instructions (Signed)
We have sent the following medications to your pharmacy for you to pick up at your convenience: Protonix.  Discontinue taking Omeprazole   Your physician has requested that you go to the basement for the following lab work before leaving today: Celiac Panel                                               We are excited to introduce MyChart, a new best-in-class service that provides you online access to important information in your electronic medical record. We want to make it easier for you to view your health information - all in one secure location - when and where you need it. We expect MyChart will enhance the quality of care and service we provide.  When you register for MyChart, you can:    View your test results.    Request appointments and receive appointment reminders via email.    Request medication renewals.    View your medical history, allergies, medications and immunizations.    Communicate with your physician's office through a password-protected site.    Conveniently print information such as your medication lists.  To find out if MyChart is right for you, please talk to a member of our clinical staff today. We will gladly answer your questions about this free health and wellness tool.  If you are age 30 or older and want a member of your family to have access to your record, you must provide written consent by completing a proxy form available at our office. Please speak to our clinical staff about guidelines regarding accounts for patients younger than age 62.  As you activate your MyChart account and need any technical assistance, please call the MyChart technical support line at [phone]) or email your question to [email or web address]. If you email your question(s), please include your name, a return phone number and the best time to reach you.  If you have non-urgent health-related questions, you can send a message to our office through  [place] at [email or web address]. If you have a medical emergency, call 911.  Thank you for using MyChart as your new health and wellness resource!   MyChart licensed from [employer],  1999-2010. Patents Pending.

## 2013-10-01 NOTE — Progress Notes (Signed)
Patient ID: Nicole Perry, female   DOB: 1976-07-04, 38 y.o.   MRN: [id number] HPI: Nicole Perry is a 38 yo female with PMH of ADD and GERD who is seen in consultation at the request of Dr. Etter Sjogren for evaluation of uncontrolled heartburn, and intermittent epigastric pain with nausea and bloating. She is here alone today. She reports off and on epigastric abdominal pain over the last several years. This can be aching and at times sharp. It doesn't seem to have a definable trigger. Not always related to eating. It can last minutes to several hours. Occasionally it is associated with nausea and bloating. Pain does not radiate. Nothing makes it better but she is aware of. Separate from this she has daily issues with heartburn. There some days this can be quite severe. At times it is worse at night. She reports a burning tightness in her epigastrium and chest. She does have water brash on occasion. They relate that she feel chest tightness that leads to dysphagia. No odynophagia. Long-standing constipation for which she treats herself with MiraLax 17 g daily. This worked very well and she is careful not to miss a day. She is having a bowel movement about every other day but without MiraLax can go as long as 2 weeks. No recent rectal bleeding or melena. She does report a history of hemorrhoids dating back to when she was pregnant. Hemorrhoids do not bother her today. No weight loss. No family history of colon cancer. No family history of IBD.  Of note her daughter, age 40, was diagnosed by small bowel biopsy with celiac and November 2014. The patient reports she had blood work done, one of which suggested possible celiac disease. No other known celiac disease in her or her husband's family. Her daughter is strictly gluten-free now but no other family members, including herself, are gluten-free  Past Medical History  Diagnosis Date  . ADD (attention deficit disorder)   . GERD (gastroesophageal reflux disease)    . Seizures     as infant    Past Surgical History  Procedure Laterality Date  . Appendectomy  1989    Current Outpatient Prescriptions  Medication Sig Dispense Refill  . acetaminophen (TYLENOL) 325 MG tablet Take 650 mg by mouth every 6 (six) hours as needed (pain).      . Acetaminophen-Caff-Pyrilamine (MIDOL MAX ST MENSTRUAL PO) Take 1 tablet by mouth every 8 (eight) hours as needed (for menstrual pain).      . polyethylene glycol (MIRALAX / GLYCOLAX) packet Take 17 g by mouth daily. One capful      . lisdexamfetamine (VYVANSE) 40 MG capsule Take 1 capsule (40 mg total) by mouth every morning.  30 capsule  0   No current facility-administered medications for this visit.    Allergies  Allergen Reactions  . Penicillins Rash    Family History  Problem Relation Age of Onset  . Breast cancer Maternal Grandmother   . Diabetes Maternal Grandmother   . Other Maternal Grandmother     Blood Cancer  . High blood pressure Maternal Grandfather   . Heart disease Maternal Grandfather   . Prostate cancer Maternal Uncle   . Multiple sclerosis Mother   . Celiac disease Daughter   . Skin cancer Mother   . Irritable bowel syndrome Maternal Grandmother   . Colon polyps Maternal Grandmother   . Colon polyps Maternal Grandfather   . Colon cancer Maternal Uncle     questionable    History  Substance Use Topics  . Smoking status: Former Research scientist (life sciences)  . Smokeless tobacco: Never Used  . Alcohol Use: No     Comment: Socially    ROS: As per history of present illness, otherwise negative  BP 100/64  Pulse 72  Ht 5\' 6"  (1.676 m)  Wt 173 lb (78.472 kg)  BMI 27.94 kg/m2  LMP 09/28/2013 Constitutional: Well-developed and well-nourished. No distress. HEENT: Normocephalic and atraumatic. Oropharynx is clear and moist. No oropharyngeal exudate. Conjunctivae are normal.  No scleral icterus. Neck: Neck supple. Trachea midline. Cardiovascular: Normal rate, regular rhythm and intact distal pulses.  No M/R/G Pulmonary/chest: Effort normal and breath sounds normal. No wheezing, rales or rhonchi. Abdominal: Soft, diffuse upper abdominal and lower abdominal tenderness without rebound or guarding, nondistended. Bowel sounds active throughout. There are no masses palpable. No hepatosplenomegaly. Extremities: no clubbing, cyanosis, or edema Lymphadenopathy: No cervical adenopathy noted. Neurological: Alert and oriented to person place and time. Skin: Skin is warm and dry. No rashes noted. Psychiatric: Normal mood and affect. Behavior is normal.  RELEVANT LABS AND IMAGING: CBC    Component Value Date/Time   WBC 5.3 08/20/2013 0910   RBC 4.39 08/20/2013 0910   HGB 13.5 08/20/2013 0910   HCT 39.4 08/20/2013 0910   PLT 238 08/20/2013 0910   MCV 89.7 08/20/2013 0910   MCH 30.8 08/20/2013 0910   MCHC 34.3 08/20/2013 0910   RDW 12.2 08/20/2013 0910   LYMPHSABS 1.4 05/24/2013 0824   MONOABS 0.3 05/24/2013 0824   EOSABS 0.1 05/24/2013 0824   BASOSABS 0.0 05/24/2013 0824    CMP     Component Value Date/Time   NA 137 05/24/2013 0824   K 3.5 05/24/2013 0824   CL 105 05/24/2013 0824   CO2 28 05/24/2013 0824   GLUCOSE 82 05/24/2013 0824   BUN 12 05/24/2013 0824   CREATININE 0.5 05/24/2013 0824   CALCIUM 8.7 05/24/2013 0824   PROT 6.7 05/24/2013 0824   ALBUMIN 4.1 05/24/2013 0824   AST 14 05/24/2013 0824   ALT 14 05/24/2013 0824   ALKPHOS 50 05/24/2013 0824   BILITOT 0.5 05/24/2013 0824   TTG - negative (4.7) Gliadin IgA - positive (35.6) Gliadin IgG - neg TSH normal  ASSESSMENT/PLAN: 38 yo female with PMH of ADD and GERD who is seen in consultation at the request of Dr. Etter Sjogren for evaluation of uncontrolled heartburn, and intermittent epigastric pain with nausea and bloating.  1.  GERD/Abd bloating/nausea/epigastric pain -- certainly her GERD is uncontrolled at this point. I do think there is a possibility that she has celiac disease but there is inconsistency in her antibodies. Her TTG was  negative which is more sensitive and specific.  I would like to repeat a celiac panel today to include TTG and an IgA.  I also have recommended upper endoscopy for small bowel biopsy. She will remain on a regular diet at this point. Starting her on pantoprazole 40 mg daily to control her GERD/heartburn symptoms. She is asked to take this 30 minutes to one hour before her first meal of the day. Risks and benefits of upper endoscopy were discussed and she is agreeable to proceed.  2.  Constipation -- she will continue MiraLax 17 g daily.  3.  Painful menstruation -- long-standing issue, plan for abdominal hysterectomy in February

## 2013-10-02 LAB — TISSUE TRANSGLUTAMINASE, IGA: Tissue Transglutaminase Ab, IgA: 3.6 U/mL (ref <20)

## 2013-10-07 ENCOUNTER — Ambulatory Visit (INDEPENDENT_AMBULATORY_CARE_PROVIDER_SITE_OTHER): Payer: BC Managed Care – PPO | Admitting: Internal Medicine

## 2013-10-07 ENCOUNTER — Encounter: Payer: Self-pay | Admitting: Internal Medicine

## 2013-10-07 VITALS — BP 108/73 | HR 89 | Temp 97.8°F | Wt 172.0 lb

## 2013-10-07 DIAGNOSIS — R05 Cough: Secondary | ICD-10-CM

## 2013-10-07 DIAGNOSIS — R059 Cough, unspecified: Secondary | ICD-10-CM

## 2013-10-07 MED ORDER — DOXYCYCLINE HYCLATE 100 MG PO TABS: 100.0000 mg | ORAL_TABLET | Freq: Two times a day (BID) | ORAL | Status: DC

## 2013-10-07 NOTE — Progress Notes (Signed)
Pre visit review using our clinic review tool, if applicable. No additional management support is needed unless otherwise documented below in the visit note. 

## 2013-10-07 NOTE — Progress Notes (Signed)
   Subjective:    Patient ID: Nicole Perry, female    DOB: 07-01-1976, 38 y.o.   MRN: [id number]  HPI Acute visit Was seen 09/04/2013, had upper respiratory symptoms, she was prescribed and finished Zithromax, symptoms improved but really never felt 100% better.  2 weeks ago symptoms started back with cough, mild shortness of breath. Last week she also developed right-sided pain in the chest and upper back which is worse with certain position but also when she takes deep breaths ----> went to another clinic, a chest x-ray was negative for PNM. She's here because she's not feeling better.  Past Medical History  Diagnosis Date  . ADD (attention deficit disorder)   . GERD (gastroesophageal reflux disease)   . Seizures     as infant   Past Surgical History  Procedure Laterality Date  . Appendectomy  1989   History  Substance Use Topics  . Smoking status: Former Research scientist (life sciences)  . Smokeless tobacco: Never Used  . Alcohol Use: No     Comment: Socially    Review of Systems No actual fever or chills 2 days ago developed sinus congestion, some ear pain and mild  sputum production, color?. Recently was seen by GI with GERD, is about to start Protonix. Birth control-- husband  vasectomy    Objective:   Physical Exam BP 108/73  Pulse 89  Temp(Src) 97.8 F (36.6 C)  Wt 172 lb (78.019 kg)  SpO2 98%  LMP 09/28/2013 General -- alert, well-developed, NAD.   HEENT-- Not pale. TMs normal, throat symmetric, no redness or discharge. Face symmetric, sinuses not tender to palpation. Nose   congested.  Lungs -- normal respiratory effort, no intercostal retractions, no accessory muscle use, and normal breath sounds.  Heart-- normal rate, regular rhythm, no murmur. Neurologic--  alert & oriented X3.   Psych-- Cognition and judgment appear intact. Cooperative with normal attention span and concentration. No anxious or depressed appearing.     Assessment & Plan:  Cough, Since the last time she was  here few weeks ago, she got better but never completely well, now continue with cough, x-ray last week somewhere else was negative for pneumonia. Plan: see Instructions, will provide a second round of antibiotics with doxycycline.

## 2013-10-07 NOTE — Patient Instructions (Signed)
For cough, take Mucinex DM twice a day as needed  For congestion use OTC Nasocort: 2 nasal sprays on each side of the nose daily until you feel better Get pseudoephedrine 30 mg (behind the counter, you need to talk with the pharmacist) take one tablet 3 or 4 times a day as needed for congestion If severe cough at night, use hydrocodone Take the antibiotic as prescribed  (doxy) Call if no better in few days Call anytime if the symptoms are severe

## 2013-10-08 ENCOUNTER — Encounter: Payer: Self-pay | Admitting: Internal Medicine

## 2013-10-14 ENCOUNTER — Telehealth: Payer: Self-pay | Admitting: Internal Medicine

## 2013-10-14 NOTE — Telephone Encounter (Signed)
No charge. 

## 2013-10-16 ENCOUNTER — Encounter: Payer: BC Managed Care – PPO | Admitting: Internal Medicine

## 2013-10-31 ENCOUNTER — Ambulatory Visit (HOSPITAL_COMMUNITY)
Admission: RE | Admit: 2013-10-31 | Payer: BC Managed Care – PPO | Source: Ambulatory Visit | Admitting: Obstetrics & Gynecology

## 2013-10-31 ENCOUNTER — Encounter (HOSPITAL_COMMUNITY): Admission: RE | Payer: Self-pay | Source: Ambulatory Visit

## 2013-10-31 SURGERY — HYSTERECTOMY, VAGINAL, LAPAROSCOPY-ASSISTED
Anesthesia: General

## 2013-12-31 ENCOUNTER — Ambulatory Visit: Payer: BC Managed Care – PPO | Admitting: Internal Medicine

## 2013-12-31 DIAGNOSIS — Z0289 Encounter for other administrative examinations: Secondary | ICD-10-CM

## 2014-04-01 ENCOUNTER — Encounter (HOSPITAL_COMMUNITY): Payer: Self-pay | Admitting: Pharmacist

## 2014-04-09 ENCOUNTER — Encounter (HOSPITAL_COMMUNITY)
Admission: RE | Admit: 2014-04-09 | Discharge: 2014-04-09 | Disposition: A | Discharge status: Home / Self Care | Payer: BC Managed Care – PPO | Source: Ambulatory Visit | Attending: Obstetrics & Gynecology | Admitting: Obstetrics & Gynecology

## 2014-04-09 ENCOUNTER — Encounter (HOSPITAL_COMMUNITY): Payer: Self-pay

## 2014-04-09 DIAGNOSIS — Z01818 Encounter for other preprocedural examination: Secondary | ICD-10-CM | POA: Insufficient documentation

## 2014-04-09 LAB — CBC
HCT: 39.1 % (ref 36.0–46.0)
Hemoglobin: 13.2 g/dL (ref 12.0–15.0)
MCH: 31.4 pg (ref 26.0–34.0)
MCHC: 33.8 g/dL (ref 30.0–36.0)
MCV: 92.9 fL (ref 78.0–100.0)
Platelets: 261 10*3/uL (ref 150–400)
RBC: 4.21 MIL/uL (ref 3.87–5.11)
RDW: 12.8 % (ref 11.5–15.5)
WBC: 6.5 10*3/uL (ref 4.0–10.5)

## 2014-04-09 NOTE — Patient Instructions (Signed)
18 Nicole Perry  04/09/2014   Your procedure is scheduled on:  04/14/14  Enter through the Main Entrance of [facility] at [place] up the phone at the desk and dial [phone].   Call this number if you have problems the morning of surgery: [phone]   Remember:   Do not eat food:After Midnight.  Do not drink clear liquids: After Midnight.  Take these medicines the morning of surgery with A SIP OF WATER: NA   Do not wear jewelry, make-up or nail polish.  Do not wear lotions, powders, or perfumes. You may wear deodorant.  Do not shave 48 hours prior to surgery.  Do not bring valuables to the hospital.  [facility] is not   responsible for any belongings or valuables brought to the hospital.  Contacts, dentures or bridgework may not be worn into surgery.  Leave suitcase in the car. After surgery it may be brought to your room.  For patients admitted to the hospital, checkout time is 11:00 AM the day of              discharge.   Patients discharged the day of surgery will not be allowed to drive             home.  Name and phone number of your driver: NA  Special Instructions:      Please read over the following fact sheets that you were given:   Surgical Site Infection Prevention

## 2014-04-13 NOTE — H&P (Signed)
Nicole Perry is an 38 y.o. female with menorrhagia and dysmenorrhea (LLQ) refractory to OCPs.  She had a normal ultrasound in the office.  She has completed childbearing and wants definitive management.  Pertinent Gynecological History: Menses: flow is excessive with use of 10 pads or tampons on heaviest days Bleeding: dysfunctional uterine bleeding Contraception: vasectomy DES exposure: unknown Blood transfusions: none Sexually transmitted diseases: no past history Previous GYN Procedures: C/S, Appy  Last mammogram: n/a' Date: n/a Last pap: normal Date: 2013 OB History: G2P2   Menstrual History: Menarche age: n/a  No LMP recorded.    Past Medical History  Diagnosis Date  . ADD (attention deficit disorder)   . GERD (gastroesophageal reflux disease)     Past Surgical History  Procedure Laterality Date  . Appendectomy  1989    Family History  Problem Relation Age of Onset  . Breast cancer Maternal Grandmother   . Diabetes Maternal Grandmother   . Other Maternal Grandmother     Blood Cancer  . High blood pressure Maternal Grandfather   . Heart disease Maternal Grandfather   . Prostate cancer Maternal Uncle   . Multiple sclerosis Mother   . Celiac disease Daughter   . Skin cancer Mother   . Irritable bowel syndrome Maternal Grandmother   . Colon polyps Maternal Grandmother   . Colon polyps Maternal Grandfather   . Colon cancer Maternal Uncle     questionable    Social History:  reports that she has quit smoking. She has never used smokeless tobacco. She reports that she drinks alcohol. She reports that she does not use illicit drugs.  Allergies:  Allergies  Allergen Reactions  . Penicillins Rash    Can tolerate Amoxicillin    No prescriptions prior to admission    ROS  There were no vitals taken for this visit. Physical Exam  Constitutional: She is oriented to person, place, and time. She appears well-developed and well-nourished.  GI: Soft. There is  no rebound and no guarding.  Neurological: She is alert and oriented to person, place, and time.  Skin: Skin is warm and dry.  Psychiatric: She has a normal mood and affect. Her behavior is normal.    No results found for this or any previous visit (from the past 24 hour(s)).  No results found.  Assessment/Plan: 37yo with menorrhagia and dysmenorrhea -LAVH, LSO  Nicole Perry 04/13/2014, 5:05 PM

## 2014-04-13 NOTE — Anesthesia Preprocedure Evaluation (Signed)
Anesthesia Evaluation  Patient identified by MRN, date of birth, ID band Patient awake    Reviewed: Allergy & Precautions, H&P , NPO status , Patient's Chart, lab work & pertinent test results  Airway Mallampati: II TM Distance: >3 FB Neck ROM: Full    Dental no notable dental hx.    Pulmonary neg pulmonary ROS, former smoker,  breath sounds clear to auscultation  Pulmonary exam normal       Cardiovascular negative cardio ROS  Rhythm:Regular Rate:Normal     Neuro/Psych negative neurological ROS  negative psych ROS   GI/Hepatic Neg liver ROS, GERD-  ,  Endo/Other  negative endocrine ROS  Renal/GU negative Renal ROS     Musculoskeletal negative musculoskeletal ROS (+)   Abdominal   Peds  Hematology negative hematology ROS (+)   Anesthesia Other Findings   Reproductive/Obstetrics negative OB ROS                           Anesthesia Physical Anesthesia Plan  ASA: I  Anesthesia Plan: General   Post-op Pain Management:    Induction: Intravenous  Airway Management Planned: Oral ETT  Additional Equipment:   Intra-op Plan:   Post-operative Plan: Extubation in OR  Informed Consent: I have reviewed the patients History and Physical, chart, labs and discussed the procedure including the risks, benefits and alternatives for the proposed anesthesia with the patient or authorized representative who has indicated his/her understanding and acceptance.   Dental advisory given  Plan Discussed with: CRNA  Anesthesia Plan Comments:         Anesthesia Quick Evaluation

## 2014-04-14 ENCOUNTER — Observation Stay (HOSPITAL_COMMUNITY)
Admission: RE | Admit: 2014-04-14 | Discharge: 2014-04-15 | Disposition: A | Discharge status: Home / Self Care | Payer: BC Managed Care – PPO | Source: Ambulatory Visit | Attending: Obstetrics & Gynecology | Admitting: Obstetrics & Gynecology

## 2014-04-14 ENCOUNTER — Encounter (HOSPITAL_COMMUNITY): Payer: Self-pay

## 2014-04-14 ENCOUNTER — Encounter (HOSPITAL_COMMUNITY)
Admission: RE | Disposition: A | Discharge status: Home / Self Care | Payer: Self-pay | Source: Ambulatory Visit | Attending: Obstetrics & Gynecology

## 2014-04-14 ENCOUNTER — Ambulatory Visit (HOSPITAL_COMMUNITY): Payer: BC Managed Care – PPO | Admitting: Anesthesiology

## 2014-04-14 ENCOUNTER — Encounter (HOSPITAL_COMMUNITY): Payer: BC Managed Care – PPO | Admitting: Anesthesiology

## 2014-04-14 DIAGNOSIS — K219 Gastro-esophageal reflux disease without esophagitis: Secondary | ICD-10-CM | POA: Insufficient documentation

## 2014-04-14 DIAGNOSIS — N946 Dysmenorrhea, unspecified: Secondary | ICD-10-CM | POA: Insufficient documentation

## 2014-04-14 DIAGNOSIS — Z803 Family history of malignant neoplasm of breast: Secondary | ICD-10-CM | POA: Insufficient documentation

## 2014-04-14 DIAGNOSIS — N8000 Endometriosis of the uterus, unspecified: Secondary | ICD-10-CM | POA: Insufficient documentation

## 2014-04-14 DIAGNOSIS — N72 Inflammatory disease of cervix uteri: Secondary | ICD-10-CM | POA: Insufficient documentation

## 2014-04-14 DIAGNOSIS — Z880 Allergy status to penicillin: Secondary | ICD-10-CM | POA: Insufficient documentation

## 2014-04-14 DIAGNOSIS — R41840 Attention and concentration deficit: Secondary | ICD-10-CM | POA: Insufficient documentation

## 2014-04-14 DIAGNOSIS — Z90710 Acquired absence of both cervix and uterus: Secondary | ICD-10-CM | POA: Diagnosis present

## 2014-04-14 DIAGNOSIS — Z87891 Personal history of nicotine dependence: Secondary | ICD-10-CM | POA: Insufficient documentation

## 2014-04-14 DIAGNOSIS — Z8249 Family history of ischemic heart disease and other diseases of the circulatory system: Secondary | ICD-10-CM | POA: Insufficient documentation

## 2014-04-14 DIAGNOSIS — N920 Excessive and frequent menstruation with regular cycle: Principal | ICD-10-CM | POA: Insufficient documentation

## 2014-04-14 DIAGNOSIS — Z833 Family history of diabetes mellitus: Secondary | ICD-10-CM | POA: Insufficient documentation

## 2014-04-14 HISTORY — PX: SALPINGOOPHORECTOMY: SHX82

## 2014-04-14 HISTORY — PX: LAPAROSCOPIC ASSISTED VAGINAL HYSTERECTOMY: SHX5398

## 2014-04-14 LAB — BASIC METABOLIC PANEL
Anion gap: 9 (ref 5–15)
BUN: 9 mg/dL (ref 6–23)
CO2: 26 mEq/L (ref 19–32)
Calcium: 8.8 mg/dL (ref 8.4–10.5)
Chloride: 104 mEq/L (ref 96–112)
Creatinine, Ser: 0.64 mg/dL (ref 0.50–1.10)
GFR calc Af Amer: >90 mL/min (ref >90)
GFR calc non Af Amer: >90 mL/min (ref >90)
Glucose, Bld: 96 mg/dL (ref 70–99)
Potassium: 3.8 mEq/L (ref 3.7–5.3)
Sodium: 139 mEq/L (ref 137–147)

## 2014-04-14 LAB — PREGNANCY, URINE: Preg Test, Ur: NEGATIVE

## 2014-04-14 SURGERY — HYSTERECTOMY, VAGINAL, LAPAROSCOPY-ASSISTED
Anesthesia: General

## 2014-04-14 MED ORDER — DOCUSATE SODIUM 100 MG PO CAPS
100.0000 mg | ORAL_CAPSULE | Freq: Two times a day (BID) | ORAL | Status: DC
  Administered 2014-04-14 – 2014-04-15 (×2): 100 mg via ORAL
  Filled 2014-04-14 (×2): qty 1

## 2014-04-14 MED ORDER — DEXTROSE IN LACTATED RINGERS 5 % IV SOLN: INTRAVENOUS | Status: DC

## 2014-04-14 MED ORDER — KETOROLAC TROMETHAMINE 30 MG/ML IJ SOLN
INTRAMUSCULAR | Status: DC | PRN
  Administered 2014-04-14: 30 mg via INTRAVENOUS

## 2014-04-14 MED ORDER — PROPOFOL 10 MG/ML IV EMUL
INTRAVENOUS | Status: AC
  Filled 2014-04-14: qty 20

## 2014-04-14 MED ORDER — NEOSTIGMINE METHYLSULFATE 10 MG/10ML IV SOLN
INTRAVENOUS | Status: AC
  Filled 2014-04-14: qty 1

## 2014-04-14 MED ORDER — ALUM & MAG HYDROXIDE-SIMETH 200-200-20 MG/5ML PO SUSP: 30.0000 mL | ORAL | Status: DC | PRN

## 2014-04-14 MED ORDER — CEFAZOLIN SODIUM-DEXTROSE 2-3 GM-% IV SOLR
INTRAVENOUS | Status: AC
  Filled 2014-04-14: qty 50

## 2014-04-14 MED ORDER — LIDOCAINE HCL (CARDIAC) 20 MG/ML IV SOLN
INTRAVENOUS | Status: AC
  Filled 2014-04-14: qty 5

## 2014-04-14 MED ORDER — FENTANYL CITRATE 0.05 MG/ML IJ SOLN
INTRAMUSCULAR | Status: AC
  Filled 2014-04-14: qty 5

## 2014-04-14 MED ORDER — GLYCOPYRROLATE 0.2 MG/ML IJ SOLN
INTRAMUSCULAR | Status: AC
  Filled 2014-04-14: qty 3

## 2014-04-14 MED ORDER — 0.9 % SODIUM CHLORIDE (POUR BTL) OPTIME
TOPICAL | Status: DC | PRN
  Administered 2014-04-14: 1000 mL

## 2014-04-14 MED ORDER — KETOROLAC TROMETHAMINE 30 MG/ML IJ SOLN: 15.0000 mg | Freq: Once | INTRAMUSCULAR | Status: DC | PRN

## 2014-04-14 MED ORDER — GLYCOPYRROLATE 0.2 MG/ML IJ SOLN
INTRAMUSCULAR | Status: DC | PRN
  Administered 2014-04-14: 0.4 mg via INTRAVENOUS

## 2014-04-14 MED ORDER — HYDROMORPHONE HCL PF 1 MG/ML IJ SOLN
INTRAMUSCULAR | Status: DC | PRN
  Administered 2014-04-14 (×2): 1 mg via INTRAVENOUS

## 2014-04-14 MED ORDER — MEPERIDINE HCL 25 MG/ML IJ SOLN: 6.2500 mg | INTRAMUSCULAR | Status: DC | PRN

## 2014-04-14 MED ORDER — KETOROLAC TROMETHAMINE 30 MG/ML IJ SOLN
30.0000 mg | Freq: Four times a day (QID) | INTRAMUSCULAR | Status: DC
  Administered 2014-04-14 – 2014-04-15 (×4): 30 mg via INTRAVENOUS
  Filled 2014-04-14 (×4): qty 1

## 2014-04-14 MED ORDER — SIMETHICONE 80 MG PO CHEW: 80.0000 mg | CHEWABLE_TABLET | Freq: Four times a day (QID) | ORAL | Status: DC | PRN

## 2014-04-14 MED ORDER — KETOROLAC TROMETHAMINE 30 MG/ML IJ SOLN
INTRAMUSCULAR | Status: AC
  Filled 2014-04-14: qty 1

## 2014-04-14 MED ORDER — NEOSTIGMINE METHYLSULFATE 10 MG/10ML IV SOLN
INTRAVENOUS | Status: DC | PRN
  Administered 2014-04-14: 3 mg via INTRAVENOUS

## 2014-04-14 MED ORDER — HYDROMORPHONE HCL PF 1 MG/ML IJ SOLN
INTRAMUSCULAR | Status: AC
  Filled 2014-04-14: qty 1

## 2014-04-14 MED ORDER — MIDAZOLAM HCL 2 MG/2ML IJ SOLN
INTRAMUSCULAR | Status: AC
  Filled 2014-04-14: qty 2

## 2014-04-14 MED ORDER — LIDOCAINE HCL (CARDIAC) 20 MG/ML IV SOLN
INTRAVENOUS | Status: DC | PRN
  Administered 2014-04-14: 80 mg via INTRAVENOUS

## 2014-04-14 MED ORDER — MENTHOL 3 MG MT LOZG: 1.0000 | LOZENGE | OROMUCOSAL | Status: DC | PRN

## 2014-04-14 MED ORDER — MIDAZOLAM HCL 2 MG/2ML IJ SOLN
INTRAMUSCULAR | Status: DC | PRN
  Administered 2014-04-14: 2 mg via INTRAVENOUS

## 2014-04-14 MED ORDER — PROPOFOL 10 MG/ML IV BOLUS
INTRAVENOUS | Status: DC | PRN
  Administered 2014-04-14: 200 mg via INTRAVENOUS

## 2014-04-14 MED ORDER — DEXAMETHASONE SODIUM PHOSPHATE 10 MG/ML IJ SOLN
INTRAMUSCULAR | Status: DC | PRN
  Administered 2014-04-14: 10 mg via INTRAVENOUS

## 2014-04-14 MED ORDER — HYDROMORPHONE HCL PF 1 MG/ML IJ SOLN
0.2000 mg | INTRAMUSCULAR | Status: DC | PRN
  Administered 2014-04-14 (×2): 0.5 mg via INTRAVENOUS
  Filled 2014-04-14 (×2): qty 1

## 2014-04-14 MED ORDER — DEXTROSE IN LACTATED RINGERS 5 % IV SOLN
INTRAVENOUS | Status: DC
  Administered 2014-04-14 – 2014-04-15 (×3): via INTRAVENOUS

## 2014-04-14 MED ORDER — ONDANSETRON HCL 4 MG/2ML IJ SOLN
INTRAMUSCULAR | Status: AC
  Filled 2014-04-14: qty 2

## 2014-04-14 MED ORDER — DEXAMETHASONE SODIUM PHOSPHATE 10 MG/ML IJ SOLN
INTRAMUSCULAR | Status: AC
  Filled 2014-04-14: qty 1

## 2014-04-14 MED ORDER — FENTANYL CITRATE 0.05 MG/ML IJ SOLN
INTRAMUSCULAR | Status: DC | PRN
  Administered 2014-04-14 (×5): 50 ug via INTRAVENOUS

## 2014-04-14 MED ORDER — ONDANSETRON HCL 4 MG/2ML IJ SOLN
INTRAMUSCULAR | Status: DC | PRN
  Administered 2014-04-14: 4 mg via INTRAVENOUS

## 2014-04-14 MED ORDER — BUPIVACAINE HCL (PF) 0.25 % IJ SOLN
INTRAMUSCULAR | Status: AC
  Filled 2014-04-14: qty 30

## 2014-04-14 MED ORDER — CEFAZOLIN SODIUM-DEXTROSE 2-3 GM-% IV SOLR
2.0000 g | INTRAVENOUS | Status: AC
  Administered 2014-04-14: 2 g via INTRAVENOUS

## 2014-04-14 MED ORDER — KETOROLAC TROMETHAMINE 30 MG/ML IJ SOLN: 30.0000 mg | Freq: Four times a day (QID) | INTRAMUSCULAR | Status: DC

## 2014-04-14 MED ORDER — HYDROMORPHONE HCL PF 1 MG/ML IJ SOLN
0.2500 mg | INTRAMUSCULAR | Status: DC | PRN
  Administered 2014-04-14: 0.25 mg via INTRAVENOUS
  Administered 2014-04-14: 0.5 mg via INTRAVENOUS
  Administered 2014-04-14: 0.25 mg via INTRAVENOUS

## 2014-04-14 MED ORDER — ROCURONIUM BROMIDE 100 MG/10ML IV SOLN
INTRAVENOUS | Status: AC
  Filled 2014-04-14: qty 1

## 2014-04-14 MED ORDER — ONDANSETRON HCL 4 MG/2ML IJ SOLN
4.0000 mg | Freq: Four times a day (QID) | INTRAMUSCULAR | Status: DC | PRN
  Administered 2014-04-14: 4 mg via INTRAVENOUS
  Filled 2014-04-14: qty 2

## 2014-04-14 MED ORDER — ACETAMINOPHEN 10 MG/ML IV SOLN: 1000.0000 mg | Freq: Once | INTRAVENOUS | Status: DC | PRN

## 2014-04-14 MED ORDER — HYDROMORPHONE HCL PF 1 MG/ML IJ SOLN
INTRAMUSCULAR | Status: AC
  Administered 2014-04-14: 0.25 mg via INTRAVENOUS
  Filled 2014-04-14: qty 1

## 2014-04-14 MED ORDER — SENNA 8.6 MG PO TABS
1.0000 | ORAL_TABLET | Freq: Two times a day (BID) | ORAL | Status: DC
Start: 2014-04-14 — End: 2014-04-15
  Administered 2014-04-14 – 2014-04-15 (×2): 8.6 mg via ORAL
  Filled 2014-04-14 (×3): qty 1

## 2014-04-14 MED ORDER — METOCLOPRAMIDE HCL 5 MG/ML IJ SOLN
10.0000 mg | Freq: Once | INTRAMUSCULAR | Status: AC | PRN
  Administered 2014-04-14: 10 mg via INTRAVENOUS

## 2014-04-14 MED ORDER — LACTATED RINGERS IV SOLN
INTRAVENOUS | Status: DC
  Administered 2014-04-14: 08:00:00 via INTRAVENOUS
  Administered 2014-04-14: 10 mL/h via INTRAVENOUS

## 2014-04-14 MED ORDER — OXYCODONE-ACETAMINOPHEN 5-325 MG PO TABS
1.0000 | ORAL_TABLET | ORAL | Status: DC | PRN
  Administered 2014-04-14 – 2014-04-15 (×3): 2 via ORAL
  Filled 2014-04-14 (×3): qty 2

## 2014-04-14 MED ORDER — BUPIVACAINE HCL (PF) 0.25 % IJ SOLN
INTRAMUSCULAR | Status: DC | PRN
  Administered 2014-04-14: 5 mL

## 2014-04-14 MED ORDER — ONDANSETRON HCL 4 MG PO TABS: 4.0000 mg | ORAL_TABLET | Freq: Four times a day (QID) | ORAL | Status: DC | PRN

## 2014-04-14 MED ORDER — ROCURONIUM BROMIDE 100 MG/10ML IV SOLN
INTRAVENOUS | Status: DC | PRN
  Administered 2014-04-14 (×2): 5 mg via INTRAVENOUS
  Administered 2014-04-14: 35 mg via INTRAVENOUS

## 2014-04-14 MED ORDER — METOCLOPRAMIDE HCL 5 MG/ML IJ SOLN
INTRAMUSCULAR | Status: AC
  Filled 2014-04-14: qty 2

## 2014-04-14 SURGICAL SUPPLY — 45 items
ADH SKN CLS APL DERMABOND .7 (GAUZE/BANDAGES/DRESSINGS) ×2
CABLE HIGH FREQUENCY MONO STRZ (ELECTRODE) IMPLANT
CANISTER SUCT 3000ML (MISCELLANEOUS) ×3 IMPLANT
CATH ROBINSON RED A/P 16FR (CATHETERS) ×3 IMPLANT
CLOTH BEACON ORANGE TIMEOUT ST (SAFETY) ×3 IMPLANT
COVER TABLE BACK 60X90 (DRAPES) ×3 IMPLANT
DECANTER SPIKE VIAL GLASS SM (MISCELLANEOUS) ×1 IMPLANT
DERMABOND ADVANCED (GAUZE/BANDAGES/DRESSINGS) ×1
DERMABOND ADVANCED .7 DNX12 (GAUZE/BANDAGES/DRESSINGS) ×2 IMPLANT
DRSG COVADERM PLUS 2X2 (GAUZE/BANDAGES/DRESSINGS) ×6 IMPLANT
DRSG OPSITE POSTOP 3X4 (GAUZE/BANDAGES/DRESSINGS) IMPLANT
DURAPREP 26ML APPLICATOR (WOUND CARE) ×3 IMPLANT
ELECT LIGASURE LONG (ELECTRODE) IMPLANT
ELECT LIGASURE SHORT 9 REUSE (ELECTRODE) ×1 IMPLANT
ELECT REM PT RETURN 9FT ADLT (ELECTROSURGICAL) ×3
ELECTRODE REM PT RTRN 9FT ADLT (ELECTROSURGICAL) IMPLANT
FILTER SMOKE EVAC LAPAROSHD (FILTER) ×3 IMPLANT
FORCEPS CUTTING 45CM 5MM (CUTTING FORCEPS) IMPLANT
GLOVE BIO SURGEON STRL SZ 6 (GLOVE) ×6 IMPLANT
GLOVE BIOGEL PI IND STRL 6 (GLOVE) ×4 IMPLANT
GLOVE BIOGEL PI IND STRL 7.0 (GLOVE) ×4 IMPLANT
GLOVE BIOGEL PI INDICATOR 6 (GLOVE) ×2
GLOVE BIOGEL PI INDICATOR 7.0 (GLOVE) ×2
GOWN STRL REUS W/ TWL LRG LVL3 (GOWN DISPOSABLE) ×14 IMPLANT
GOWN STRL REUS W/TWL LRG LVL3 (GOWN DISPOSABLE) ×21
LEGGING LITHOTOMY PAIR STRL (DRAPES) ×1 IMPLANT
MANIPULATOR UTERINE 4.5 ZUMI (MISCELLANEOUS) ×3 IMPLANT
NEEDLE INSUFFLATION 120MM (ENDOMECHANICALS) ×3 IMPLANT
NS IRRIG 1000ML POUR BTL (IV SOLUTION) ×3 IMPLANT
PACK LAVH (CUSTOM PROCEDURE TRAY) ×3 IMPLANT
PROTECTOR NERVE ULNAR (MISCELLANEOUS) ×3 IMPLANT
SEALER TISSUE G2 CVD JAW 45CM (ENDOMECHANICALS) IMPLANT
SET IRRIG TUBING LAPAROSCOPIC (IRRIGATION / IRRIGATOR) IMPLANT
SUT MNCRL 0 MO-4 VIOLET 18 CR (SUTURE) ×4 IMPLANT
SUT MNCRL AB 3-0 PS2 27 (SUTURE) ×3 IMPLANT
SUT MON AB 2-0 CT1 36 (SUTURE) ×3 IMPLANT
SUT MONOCRYL 0 MO 4 18  CR/8 (SUTURE) ×2
SUT VICRYL 0 TIES 12 18 (SUTURE) ×3 IMPLANT
SUT VICRYL 0 UR6 27IN ABS (SUTURE) ×3 IMPLANT
TOWEL OR 17X24 6PK STRL BLUE (TOWEL DISPOSABLE) ×6 IMPLANT
TRAY FOLEY CATH 14FR (SET/KITS/TRAYS/PACK) ×3 IMPLANT
TROCAR XCEL NON-BLD 11X100MML (ENDOMECHANICALS) ×3 IMPLANT
TROCAR XCEL NON-BLD 5MMX100MML (ENDOMECHANICALS) ×3 IMPLANT
TROCAR XCEL OPT SLVE 5M 100M (ENDOMECHANICALS) ×3 IMPLANT
WARMER LAPAROSCOPE (MISCELLANEOUS) ×3 IMPLANT

## 2014-04-14 NOTE — Progress Notes (Signed)
C/O back pain and abdominal cramping.  No nausea or vomiting.  No CP/SOB.   VSS. UOP adequate and clear  Gen: A&O x 3 Abdo: soft, ND, inc c/d x 2 Ext: no c/c/e  37yo POD#0 s/p LAVH -Pain and nausea control -Advance diet -D/C Foley in AM -Saline lock IVF in AM -[place], DO

## 2014-04-14 NOTE — Anesthesia Postprocedure Evaluation (Signed)
  Anesthesia Post-op Note  Anesthesia Post Note  Patient: Nicole Perry  Procedure(s) Performed: Procedure(s) (LRB): LAPAROSCOPIC ASSISTED VAGINAL HYSTERECTOMY (N/A) Bilateral salpingectomy  (Bilateral)  Anesthesia type: General  Patient location: PACU  Post pain: Pain level controlled  Post assessment: Post-op Vital signs reviewed  Last Vitals:  Filed Vitals:   04/14/14 1015  BP: 105/49  Pulse: 58  Temp: 36.5 C  Resp: 10    Post vital signs: Reviewed  Level of consciousness: sedated  Complications: No apparent anesthesia complications

## 2014-04-14 NOTE — Progress Notes (Signed)
Patient has elected to keep both ovaries.  She understands that if an abnormality is found with either ovary, it will be managed surgically today.    Will proceed with LAVH, bilateral salpingectomy.  No other change to H&P.  Linda Hedges, DO

## 2014-04-14 NOTE — Transfer of Care (Signed)
Immediate Anesthesia Transfer of Care Note  Patient: Nicole Perry  Procedure(s) Performed: Procedure(s): LAPAROSCOPIC ASSISTED VAGINAL HYSTERECTOMY (N/A) Bilateral salpingectomy  (Bilateral)  Patient Location: PACU  Anesthesia Type:General  Level of Consciousness: awake, alert  and oriented  Airway & Oxygen Therapy: Patient Spontanous Breathing and Patient connected to nasal cannula oxygen  Post-op Assessment: Report given to PACU RN, Post -op Vital signs reviewed and stable and Patient moving all extremities X 4  Post vital signs: Reviewed and stable  Complications: No apparent anesthesia complications

## 2014-04-14 NOTE — Op Note (Signed)
PROCEDURE DATE: 04/14/2014 PREOPERATIVE DIAGNOSIS: Menorrhagia, dysmenorrhea  POSTOPERATIVE DIAGNOSIS: The same  PROCEDURE: Laparoscopic Assisted Vaginal Hysterectomy  SURGEON: Dr. Linda Hedges  ASSISTANT: Dr. Marylynn Pearson INDICATIONS: 38 y.o. G2P2 with menorrhagia and dysmenorrhea desiring definitive surgical management. Risks of surgery were discussed with the patient including but not limited to: bleeding which may require transfusion or reoperation; infection which may require antibiotics; injury to bowel, bladder, ureters or other surrounding organs; need for additional procedures including laparotomy; thromboembolic phenomenon, incisional problems and other postoperative/anesthesia complications. Written informed consent was obtained.  FINDINGS: Small uterus, normal adnexa bilaterally. No evidence of endometriosis. Normal upper abdomen and appendix ANESTHESIA: General  ESTIMATED BLOOD LOSS: 250 cc SPECIMENS: Uterus, cervix, and bilateral fallopian tubes  COMPLICATIONS: None immediate  PROCEDURE IN DETAIL: The patient received intravenous antibiotics and had sequential compression devices applied to her lower extremities while in the preoperative area. She was then taken to the operating room where general anesthesia was administered and was found to be adequate. She was placed in the dorsal lithotomy position, and was prepped and draped in a sterile manner. An in and out catheterization was performed. A uterine manipulator was then advanced into the uterus . After an adequate timeout was performed, attention was then turned to the patient's abdomen where a 10-mm skin incision was made in the umbilical fold. The Veress needle was carefully introduced into the peritoneal cavity through the abdominal wall. Intraperitoneal placement was confirmed by drop in intraabdominal pressure with insufflation of carbon dioxide gas. Adequate pneumoperitoneum was obtained, and the 10/11 XL trocar and sleeve were  then advanced without difficulty into the abdomen where intraabdominal placement was confirmed by the laparoscope. A survey of the patient's pelvis and abdomen revealed entirely normal anatomy. Suprapubic 5 XL port was then placed under direct visualization. The pelvis was then carefully examined. On the right side, the fallopian tube was removed using the Gyrus to allow better visualization.  The round ligament was then clamped and transected with the Gyrus. The uteroovarian ligament was also clamped and transected. The leaves of the broad ligament were separated and serially transected. These procedures were then repeated on the left side. The ureters were noted to be safely away from the area of dissection.  At this point, attention was turned to the vaginal portion of the case. A weighted speculum was placed posteriorly, a Deaver anteriorly, and the cervix grasped with a thyroid tenaculum. Once the anterior and posterior reflections were identified, the cervix was circumscribed using the Bovie knife. Next, using Mayos, the posterior cul-de-sac was entered. The LigaSure was then used to grasp the uterosacrals which were coapted and cut. Next, the bladder reflection was identified. Using Metzenbaums, it was entered and palpation and direct visualization confirmed proper location. Next, using the LigaSure, the uterine arteries were coapted and cut bilaterally. The pedicles were visualized after coaptation and were hemostatic. The same was performed sequentially cephalad until the uterus and cervix were removed. The pedicles were inspected and found to be hemostatic.  Next, the uterosacrals were tagged with monocryl bilaterally.  The uterosacrals were brought together in the midline cuff closure with a figure-of-8 stitch using monocryl followed by the remainder of the cuff closure in the same fashion. The cuff was inspected and found to be hemostatic.  Attention was returned to the abdomen were a second  laparoscopic look was taken. All pedicles were hemostatic.  Insufflation was removed after all instruments were removed.  The infraumbilical fascial incision was closed with 2-0 Vicryl  in an interrupted stitch.  All skin incisions were closed with 4-0 Vicryl subcuticular stitches and Dermabond. The patient tolerated the procedures well. All instruments, needles, and sponge counts were correct x 2. The patient was taken to the recovery room awake, extubated and in stable condition.

## 2014-04-15 ENCOUNTER — Encounter (HOSPITAL_COMMUNITY): Payer: Self-pay | Admitting: Obstetrics & Gynecology

## 2014-04-15 LAB — CBC
HCT: 34.3 % — ABNORMAL LOW (ref 36.0–46.0)
Hemoglobin: 11.3 g/dL — ABNORMAL LOW (ref 12.0–15.0)
MCH: 30.8 pg (ref 26.0–34.0)
MCHC: 32.9 g/dL (ref 30.0–36.0)
MCV: 93.5 fL (ref 78.0–100.0)
Platelets: 261 10*3/uL (ref 150–400)
RBC: 3.67 MIL/uL — ABNORMAL LOW (ref 3.87–5.11)
RDW: 12.8 % (ref 11.5–15.5)
WBC: 9.8 10*3/uL (ref 4.0–10.5)

## 2014-04-15 LAB — BASIC METABOLIC PANEL
Anion gap: 8 (ref 5–15)
BUN: 6 mg/dL (ref 6–23)
CO2: 28 mEq/L (ref 19–32)
Calcium: 8.8 mg/dL (ref 8.4–10.5)
Chloride: 105 mEq/L (ref 96–112)
Creatinine, Ser: 0.6 mg/dL (ref 0.50–1.10)
GFR calc Af Amer: >90 mL/min (ref >90)
GFR calc non Af Amer: >90 mL/min (ref >90)
Glucose, Bld: 116 mg/dL — ABNORMAL HIGH (ref 70–99)
Potassium: 3.8 mEq/L (ref 3.7–5.3)
Sodium: 141 mEq/L (ref 137–147)

## 2014-04-15 MED ORDER — OXYCODONE-ACETAMINOPHEN 5-325 MG PO TABS: 1.0000 | ORAL_TABLET | ORAL | Status: DC | PRN

## 2014-04-15 MED ORDER — IBUPROFEN 600 MG PO TABS: 600.0000 mg | ORAL_TABLET | Freq: Four times a day (QID) | ORAL | Status: DC | PRN

## 2014-04-15 NOTE — Addendum Note (Signed)
Addendum created 04/15/14 0920 by Tobin Chad, CRNA   Modules edited: Notes Section   Notes Section:  File: [id number]

## 2014-04-15 NOTE — Progress Notes (Signed)
Pt  Out in wheelchair   Teaching complete  

## 2014-04-15 NOTE — Discharge Summary (Signed)
Physician Discharge Summary  Patient ID: Nicole Perry MRN: [id number] DOB/AGE: 03/05/1976 38 y.o.  Admit date: 04/14/2014 Discharge date: 04/15/2014  Admission Diagnoses:  Menorrhagia, dysmenorrhea  Discharge Diagnoses:   SAA Active Problems:   S/P hysterectomy   Discharged Condition: good  Hospital Course: Admitted for planned LAVH.  Procedure was performed without complication.  Patient was meeting all discharge goals on postop day #1 and was discharged home.  Consults: None  Significant Diagnostic Studies: labs: Hgb 11.3  Treatments: surgery: LAVH  Discharge Exam: Blood pressure 98/61, pulse 74, temperature 98.1 F (36.7 C), temperature source Oral, resp. rate 18, height 5\' 5"  (1.651 m), weight 169 lb (76.658 kg), SpO2 97.00%. General appearance: alert, cooperative and appears stated age GI: Appropriate tenderness to palpation, inc c/d/i x 2 Extremities: extremities normal, atraumatic, no cyanosis or edema  Disposition: 01-Home or Self Care     Medication List    STOP taking these medications       MIDOL MAX ST MENSTRUAL PO      TAKE these medications       acetaminophen 325 MG tablet  Commonly known as:  TYLENOL  Take 650 mg by mouth every 6 (six) hours as needed (pain).     ibuprofen 600 MG tablet  Commonly known as:  ADVIL,MOTRIN  Take 1 tablet (600 mg total) by mouth every 6 (six) hours as needed.     omeprazole 20 MG capsule  Commonly known as:  PRILOSEC  Take 20 mg by mouth daily.     oxyCODONE-acetaminophen 5-325 MG per tablet  Commonly known as:  PERCOCET/ROXICET  Take 1-2 tablets by mouth every 4 (four) hours as needed for severe pain (moderate to severe pain (when tolerating fluids)).     polyethylene glycol packet  Commonly known as:  MIRALAX / GLYCOLAX  Take 17 g by mouth daily. One capful         Signed: Karlynn Furrow 04/15/2014, 10:47 AM

## 2014-04-15 NOTE — Progress Notes (Signed)
1 Day Post-Op Procedure(s) (LRB): LAPAROSCOPIC ASSISTED VAGINAL HYSTERECTOMY (N/A) Bilateral salpingectomy  (Bilateral)  Subjective: Patient reports tolerating PO and no problems voiding.    Objective: I have reviewed patient's vital signs, intake and output, medications and labs.  General: alert, cooperative and appears stated age GI: soft, non-tender; bowel sounds normal; no masses,  no organomegaly and incision: clean, dry and intact Extremities: extremities normal, atraumatic, no cyanosis or edema  Assessment: s/p Procedure(s): LAPAROSCOPIC ASSISTED VAGINAL HYSTERECTOMY (N/A) Bilateral salpingectomy  (Bilateral): stable, progressing well and tolerating diet  Plan: Discharge home  LOS: 1 day    Nicole Perry 04/15/2014, 10:45 AM

## 2014-04-15 NOTE — Anesthesia Postprocedure Evaluation (Signed)
  Anesthesia Post-op Note  Patient: Nicole Perry  Procedure(s) Performed: Procedure(s): LAPAROSCOPIC ASSISTED VAGINAL HYSTERECTOMY (N/A) Bilateral salpingectomy  (Bilateral)  Patient Location: PACU and Women's Unit  Anesthesia Type:General  Level of Consciousness: awake and patient cooperative  Airway and Oxygen Therapy: Patient Spontanous Breathing  Post-op Pain: none  Post-op Assessment: Patient's Cardiovascular Status Stable, Respiratory Function Stable, Patent Airway, No signs of Nausea or vomiting, Adequate PO intake and Pain level controlled  Post-op Vital Signs: Reviewed and stable  Last Vitals:  Filed Vitals:   04/15/14 0555  BP: 100/54  Pulse: 73  Temp: 36.8 C  Resp: 18    Complications: No apparent anesthesia complications

## 2014-04-15 NOTE — Discharge Instructions (Signed)
Call MD for T>100.4, heavy vaginal bleeding, severe abdominal pain, intractable nausea and/or vomiting.  Call office to schedule postoperative appointment in 2 weeks.  No driving while taking pain medication.  Pelvic rest x 6 weeks.

## 2014-10-01 ENCOUNTER — Telehealth: Payer: Self-pay | Admitting: Family Medicine

## 2014-10-01 NOTE — Telephone Encounter (Signed)
ok 

## 2014-10-01 NOTE — Telephone Encounter (Signed)
Ok to switch 

## 2014-10-01 NOTE — Telephone Encounter (Signed)
Pt would like to establish new patient care with Dr. Birdie Riddle and  transfer from [facility].

## 2014-10-08 ENCOUNTER — Emergency Department (HOSPITAL_BASED_OUTPATIENT_CLINIC_OR_DEPARTMENT_OTHER): Payer: BLUE CROSS/BLUE SHIELD

## 2014-10-08 ENCOUNTER — Emergency Department (HOSPITAL_BASED_OUTPATIENT_CLINIC_OR_DEPARTMENT_OTHER)
Admission: EM | Admit: 2014-10-08 | Discharge: 2014-10-08 | Disposition: A | Discharge status: Home / Self Care | Payer: BLUE CROSS/BLUE SHIELD | Attending: Emergency Medicine | Admitting: Emergency Medicine

## 2014-10-08 ENCOUNTER — Ambulatory Visit (INDEPENDENT_AMBULATORY_CARE_PROVIDER_SITE_OTHER): Payer: BLUE CROSS/BLUE SHIELD | Admitting: Medical

## 2014-10-08 ENCOUNTER — Encounter (HOSPITAL_BASED_OUTPATIENT_CLINIC_OR_DEPARTMENT_OTHER): Payer: Self-pay

## 2014-10-08 ENCOUNTER — Encounter: Payer: Self-pay | Admitting: Medical

## 2014-10-08 VITALS — BP 124/72 | HR 100 | Temp 99.2°F | Ht 66.0 in | Wt 160.2 lb

## 2014-10-08 DIAGNOSIS — R0602 Shortness of breath: Secondary | ICD-10-CM

## 2014-10-08 DIAGNOSIS — Z8659 Personal history of other mental and behavioral disorders: Secondary | ICD-10-CM | POA: Diagnosis not present

## 2014-10-08 DIAGNOSIS — Z880 Allergy status to penicillin: Secondary | ICD-10-CM | POA: Insufficient documentation

## 2014-10-08 DIAGNOSIS — Z87891 Personal history of nicotine dependence: Secondary | ICD-10-CM | POA: Insufficient documentation

## 2014-10-08 DIAGNOSIS — K219 Gastro-esophageal reflux disease without esophagitis: Secondary | ICD-10-CM | POA: Diagnosis not present

## 2014-10-08 DIAGNOSIS — R0789 Other chest pain: Secondary | ICD-10-CM

## 2014-10-08 DIAGNOSIS — Z79899 Other long term (current) drug therapy: Secondary | ICD-10-CM | POA: Insufficient documentation

## 2014-10-08 DIAGNOSIS — R0600 Dyspnea, unspecified: Secondary | ICD-10-CM

## 2014-10-08 LAB — CBC WITH DIFFERENTIAL/PLATELET
Basophils Absolute: 0 10*3/uL (ref 0.0–0.1)
Basophils Relative: 0 % (ref 0–1)
Eosinophils Absolute: 0.1 10*3/uL (ref 0.0–0.7)
Eosinophils Relative: 1 % (ref 0–5)
HCT: 41 % (ref 36.0–46.0)
Hemoglobin: 13.7 g/dL (ref 12.0–15.0)
Lymphocytes Relative: 20 % (ref 12–46)
Lymphs Abs: 1.5 10*3/uL (ref 0.7–4.0)
MCH: 30.1 pg (ref 26.0–34.0)
MCHC: 33.4 g/dL (ref 30.0–36.0)
MCV: 90.1 fL (ref 78.0–100.0)
Monocytes Absolute: 0.6 10*3/uL (ref 0.1–1.0)
Monocytes Relative: 8 % (ref 3–12)
Neutro Abs: 5.3 10*3/uL (ref 1.7–7.7)
Neutrophils Relative %: 71 % (ref 43–77)
Platelets: 282 10*3/uL (ref 150–400)
RBC: 4.55 MIL/uL (ref 3.87–5.11)
RDW: 12.5 % (ref 11.5–15.5)
WBC: 7.5 10*3/uL (ref 4.0–10.5)

## 2014-10-08 LAB — BASIC METABOLIC PANEL
Anion gap: 6 (ref 5–15)
BUN: 14 mg/dL (ref 6–23)
CO2: 26 mmol/L (ref 19–32)
Calcium: 9.4 mg/dL (ref 8.4–10.5)
Chloride: 106 mEq/L (ref 96–112)
Creatinine, Ser: 0.67 mg/dL (ref 0.50–1.10)
GFR calc Af Amer: >90 mL/min (ref >90)
GFR calc non Af Amer: >90 mL/min (ref >90)
Glucose, Bld: 125 mg/dL — ABNORMAL HIGH (ref 70–99)
Potassium: 3.4 mmol/L — ABNORMAL LOW (ref 3.5–5.1)
Sodium: 138 mmol/L (ref 135–145)

## 2014-10-08 LAB — TROPONIN I: Troponin I: <0.03 ng/mL (ref <0.031)

## 2014-10-08 LAB — D-DIMER, QUANTITATIVE: D-Dimer, Quant: 0.3 ug/mL-FEU (ref 0.00–0.48)

## 2014-10-08 NOTE — ED Notes (Signed)
Pt reports left sided pain under ribs with shortness of breath since Friday. Sent by pmd for eval

## 2014-10-08 NOTE — Patient Instructions (Signed)
With your significant dyspnea last night, palpitation, mild left bicep pain and pulse rate of 140 easily on ambulation, I think ED evaluation now is needed. You may have walking pneumonia, small pneumothorax or other condition. You have no history of asthma and no current wheezing so I don't think RAD.  I have called down to ED and talked with the charge nurse.

## 2014-10-08 NOTE — Discharge Instructions (Signed)

## 2014-10-08 NOTE — ED Provider Notes (Signed)
CSN: [id number]     Arrival date & time 10/08/14  1645 History   First MD Initiated Contact with Patient 10/08/14 1716     Chief Complaint  Patient presents with  . Shortness of Breath     (Consider location/radiation/quality/duration/timing/severity/associated sxs/prior Treatment) HPI Comments: Patient presents to the ER for evaluation of left sided chest pain. Patient reports that she started having shortness of breath several days ago. She felt like she could not catch her breath or get a deep breath. Yesterday she started having pain on the left side of her chest, under her breast, when she takes a deep breath. No continuous chest pain. She has not injured herself. Patient denies any history of blood clots. She has not noticed any leg or calf pain or swelling.  Patient is a 39 y.o. female presenting with shortness of breath.  Shortness of Breath Associated symptoms: chest pain     Past Medical History  Diagnosis Date  . ADD (attention deficit disorder)   . GERD (gastroesophageal reflux disease)    Past Surgical History  Procedure Laterality Date  . Appendectomy  1989  . Laparoscopic assisted vaginal hysterectomy N/A 04/14/2014    Procedure: LAPAROSCOPIC ASSISTED VAGINAL HYSTERECTOMY;  Surgeon: Linda Hedges, DO;  Location: [facility];  Service: Gynecology;  Laterality: N/A;  . Salpingoophorectomy Bilateral 04/14/2014    Procedure: Bilateral salpingectomy ;  Surgeon: Linda Hedges, DO;  Location: [facility];  Service: Gynecology;  Laterality: Bilateral;   Family History  Problem Relation Age of Onset  . Breast cancer Maternal Grandmother   . Diabetes Maternal Grandmother   . Other Maternal Grandmother     Blood Cancer  . High blood pressure Maternal Grandfather   . Heart disease Maternal Grandfather   . Prostate cancer Maternal Uncle   . Multiple sclerosis Mother   . Celiac disease Daughter   . Skin cancer Mother   . Irritable bowel syndrome Maternal Grandmother   . Colon polyps  Maternal Grandmother   . Colon polyps Maternal Grandfather   . Colon cancer Maternal Uncle     questionable   History  Substance Use Topics  . Smoking status: Former Smoker    Quit date: 04/14/2010  . Smokeless tobacco: Never Used  . Alcohol Use: Yes     Comment: Socially   OB History    No data available     Review of Systems  Respiratory: Positive for shortness of breath.   Cardiovascular: Positive for chest pain.  All other systems reviewed and are negative.     Allergies  Penicillins  Home Medications   Prior to Admission medications   Medication Sig Start Date End Date Taking? Authorizing Provider  acetaminophen (TYLENOL) 325 MG tablet Take 650 mg by mouth every 6 (six) hours as needed (pain).    Historical Provider, MD  ibuprofen (ADVIL,MOTRIN) 600 MG tablet Take 1 tablet (600 mg total) by mouth every 6 (six) hours as needed. Patient not taking: Reported on 10/08/2014 04/15/14   Linda Hedges, DO  omeprazole (PRILOSEC) 20 MG capsule Take 20 mg by mouth daily.    Historical Provider, MD  oxyCODONE-acetaminophen (PERCOCET/ROXICET) 5-325 MG per tablet Take 1-2 tablets by mouth every 4 (four) hours as needed for severe pain (moderate to severe pain (when tolerating fluids)). Patient not taking: Reported on 10/08/2014 04/15/14   Linda Hedges, DO  polyethylene glycol (MIRALAX / GLYCOLAX) packet Take 17 g by mouth daily. One capful    Historical Provider, MD   BP 116/67  mmHg  Pulse 115  Temp(Src) 98.1 F (36.7 C) (Oral)  Ht 5\' 5"  (1.651 m)  Wt 160 lb (72.576 kg)  BMI 26.63 kg/m2  SpO2 100%  LMP 03/11/2014 Physical Exam  Constitutional: She is oriented to person, place, and time. She appears well-developed and well-nourished. No distress.  HENT:  Head: Normocephalic and atraumatic.  Right Ear: Hearing normal.  Left Ear: Hearing normal.  Nose: Nose normal.  Mouth/Throat: Oropharynx is clear and moist and mucous membranes are normal.  Eyes: Conjunctivae and EOM are  normal. Pupils are equal, round, and reactive to light.  Neck: Normal range of motion. Neck supple.  Cardiovascular: Regular rhythm, S1 normal and S2 normal.  Exam reveals no gallop and no friction rub.   No murmur heard. Pulmonary/Chest: Effort normal and breath sounds normal. No respiratory distress. She exhibits no tenderness.  Abdominal: Soft. Normal appearance and bowel sounds are normal. There is no hepatosplenomegaly. There is no tenderness. There is no rebound, no guarding, no tenderness at McBurney's point and negative Murphy's sign. No hernia.  Musculoskeletal: Normal range of motion.  Neurological: She is alert and oriented to person, place, and time. She has normal strength. No cranial nerve deficit or sensory deficit. Coordination normal. GCS eye subscore is 4. GCS verbal subscore is 5. GCS motor subscore is 6.  Skin: Skin is warm, dry and intact. No rash noted. No cyanosis.  Psychiatric: She has a normal mood and affect. Her speech is normal and behavior is normal. Thought content normal.  Nursing note and vitals reviewed.   ED Course  Procedures (including critical care time) Labs Review Labs Reviewed  BASIC METABOLIC PANEL - Abnormal; Notable for the following:    Potassium 3.4 (*)    Glucose, Bld 125 (*)    All other components within normal limits  CBC WITH DIFFERENTIAL  TROPONIN I  D-DIMER, QUANTITATIVE    Imaging Review Dg Chest 2 View  10/08/2014   CLINICAL DATA:  Acute shortness of breath for 1 week with chest pain  EXAM: CHEST  2 VIEW  COMPARISON:  09/28/2013  FINDINGS: The heart size and mediastinal contours are within normal limits. Both lungs are clear. The visualized skeletal structures are unremarkable.  IMPRESSION: No active cardiopulmonary disease.   Electronically Signed   By: Daryll Brod M.D.   On: 10/08/2014 17:51     EKG Interpretation   Date/Time:  Wednesday October 08 2014 16:56:16 EST Ventricular Rate:  108 PR Interval:  140 QRS Duration:  72 QT Interval:  326 QTC Calculation: 436 R Axis:   84 Text Interpretation:  Sinus tachycardia Nonspecific ST abnormality  Abnormal ECG No previous tracing Confirmed by Latonia Conrow  MD, Patsye Sullivant  [phone]) on 10/08/2014 5:16:50 PM      MDM   Final diagnoses:  Shortness of breath  chest wall pain  Patient presents to the ER for evaluation of sharp pain under her left breast in the area of her ribs that occurs when she takes a deep breath. She feels like she is not able to get a full breath. The area is not tender. No overlying skin changes. Patient does not have any cardiac risk factors. This is very atypical for cardiac etiology. Troponin is negative. EKG does not show any overt ischemia or infarct. D-dimer is also negative. She is not hypoxic, room air oxygen saturation 98-100%. I believe the negative d-dimer is enough to rule out PE in this patient who does not have any dependent risk factors for PE.  She'll be treated with anti-inflammatory medication and rest. Return if her symptoms worsen, otherwise follow up with primary doctor.    Orpah Greek, MD 10/08/14 [phone]

## 2014-10-08 NOTE — Progress Notes (Signed)
Pre visit review using our clinic review tool, if applicable. No additional management support is needed unless otherwise documented below in the visit note. 

## 2014-10-08 NOTE — Progress Notes (Signed)
   Subjective:    Patient ID: Nicole Perry, female    DOB: 1976-04-12, 39 y.o.   MRN: [id number]  HPI   Pt in today reporting maybe mild  nasal congestion and maybe sinus pressure x 5   Days.  Pt denies any wheezing. She feels like she can't take a full deep breath. But then occasionally states can get full deep breath. She states faint sore area over left lower ribs. Little sore with movement. Mild pain in this area on deep breathing. No hx of asthma. No hx of smoking. Pt in past when had bronchitis she used inhalers.  No popliteal pain.  Pt got nervous last night when she felt like could not breath. Her lt bicep felt dull achy after she got nervous. Then resolved 20 minutes.  No jaw pain. No sweating at that time. No chest pressure.  No young members of family with heart issues or sudden death. Paternal grandad at 62 yo had MI. But lived to 70 yo.   Today only nasal congestion, sinus pressure and feels like can't take full deep breath with lt lower rib pain. Some dyspnea.  LMP- hysterectomy.  Associated symptoms( below yes or no)  Fever-No. Occasional face flush. Chills-yes. Maybe. Sweat shirt, blanket, and comfort. She was not warm. Unusual for her. Chest congestion-No Sneezing- no Itching eyes- Sore throat- no Post-nasal drainage-Yes Wheezing-No Purulent drainage-No Fatigue-yes      Review of Systems  Constitutional: Negative for fever, chills and fatigue.  Respiratory: Positive for shortness of breath. Negative for cough, chest tightness and wheezing.   Cardiovascular: Negative for chest pain and palpitations.       Lt lower rib region pain under her left breast.  Gastrointestinal: Negative for abdominal pain.  Musculoskeletal: Negative for back pain.  Neurological: Negative for dizziness, seizures, syncope, facial asymmetry, speech difficulty, weakness, light-headedness, numbness and headaches.  Hematological: Negative for adenopathy. Does not bruise/bleed  easily.  Psychiatric/Behavioral: Negative for behavioral problems and confusion.       Objective:   Physical Exam   General Mental Status- Alert. General Appearance- Not in acute distress.   Skin General: Color- Normal Color. Moisture- Normal Moisture.  Neck Carotid Arteries- Normal color. Moisture- Normal Moisture. No carotid bruits. No JVD.  Chest and Lung Exam Auscultation: Breath Sounds:-Normal. Even. But she seems to have mild shortness of breath.  Cardiovascular Auscultation:Rythm- mild tachycardic but regular Murmurs & Other Heart Sounds:Auscultation of the heart reveals- No Murmurs.  Abdomen Inspection:-Inspeection Normal. Palpation/Percussion:Note:No mass. Palpation and Percussion of the abdomen reveal- Non Tender, Non Distended + BS, no rebound or guarding.    Neurologic Cranial Nerve exam:- CN III-XII intact(No nystagmus), symmetric smile. Drift Test:- No drift. Romberg Exam:- Negative.  Heal to Toe Gait exam:-Normal. Finger to Nose:- Normal/Intact Strength:- 5/5 equal and symmetric strength both upper and lower extremities.  Lower ext- no  Pedal edema. Negative homans signs,        Assessment & Plan:  Ambulating down hall her pulse went to 142. She felt short winded.

## 2014-10-08 NOTE — Assessment & Plan Note (Signed)
With your significant dyspnea last night, palpitation, mild left bicep pain and pulse rate of 140 easily on ambulation, I think ED evaluation now is needed. You may have walking pneumonia, small pneumothorax or other condition. You have no history of asthma and no current wheezing so I don't think RAD.  I have called down to ED and talked with the charge nurse.

## 2014-10-10 ENCOUNTER — Ambulatory Visit (INDEPENDENT_AMBULATORY_CARE_PROVIDER_SITE_OTHER): Payer: BLUE CROSS/BLUE SHIELD | Admitting: Medical

## 2014-10-10 ENCOUNTER — Encounter: Payer: Self-pay | Admitting: Medical

## 2014-10-10 VITALS — BP 109/72 | HR 99 | Temp 98.3°F | Ht 66.0 in | Wt 161.6 lb

## 2014-10-10 DIAGNOSIS — R0602 Shortness of breath: Secondary | ICD-10-CM

## 2014-10-10 MED ORDER — BECLOMETHASONE DIPROPIONATE 40 MCG/ACT IN AERS: 2.0000 | INHALATION_SPRAY | Freq: Two times a day (BID) | RESPIRATORY_TRACT | Status: DC

## 2014-10-10 MED ORDER — PREDNISONE 20 MG PO TABS: ORAL_TABLET | ORAL | Status: DC

## 2014-10-10 MED ORDER — IPRATROPIUM-ALBUTEROL 0.5-2.5 (3) MG/3ML IN SOLN: 3.0000 mL | RESPIRATORY_TRACT | Status: DC

## 2014-10-10 MED ORDER — ALBUTEROL SULFATE HFA 108 (90 BASE) MCG/ACT IN AERS: 2.0000 | INHALATION_SPRAY | Freq: Four times a day (QID) | RESPIRATORY_TRACT | Status: DC | PRN

## 2014-10-10 NOTE — Progress Notes (Signed)
Subjective:    Patient ID: Nicole Perry, female    DOB: Feb 22, 1976, 39 y.o.   MRN: [id number]  HPI   Pt in for follow up. She had some pain in her left lower rib area pain. Based on pt last visit with me and her sob with left lower rib/chest pain,  I sent her to the  ED. She had neg troponin, neg d-dimer, neg cxr, and a cbc.  Pt still feels the same as before. She feels short of breath intermittently. Last time severe was this am. Lasted 6-8 mintues but she is complaining of constant shortness of breath since I saw. Pt tried old albuterol inhaler this am and it did not help. She describes as if she can't take full deep breath.  She is not reorting any mid sternal chest pain. Just transient pain mild left lower rib area. No jaw pain, no arm pain, no diaphoresis, no chest pain, no fever and non chills.  No pain in back of her legs. No weight gain.  Hx of hysterctomy.  Non smoker. Social smoker years. Off and on 10 yrs. Occasional use inhaler with bronchitits.  After ED told to take ibuprofen. Not helping. Pain still left lower rib area pain. Mild dull soreness. Sob sensation is the worst.  Past Medical History  Diagnosis Date  . ADD (attention deficit disorder)   . GERD (gastroesophageal reflux disease)     History   Social History  . Marital Status: Married    Spouse Name: N/A    Number of Children: 2  . Years of Education: N/A   Occupational History  . Inventory Control    Social History Main Topics  . Smoking status: Former Smoker    Quit date: 04/14/2010  . Smokeless tobacco: Never Used  . Alcohol Use: Yes     Comment: Socially  . Drug Use: No  . Sexual Activity: Not on file   Other Topics Concern  . Not on file   Social History Narrative    Past Surgical History  Procedure Laterality Date  . Appendectomy  1989  . Laparoscopic assisted vaginal hysterectomy N/A 04/14/2014    Procedure: LAPAROSCOPIC ASSISTED VAGINAL HYSTERECTOMY;  Surgeon: Linda Hedges,  DO;  Location: [facility];  Service: Gynecology;  Laterality: N/A;  . Salpingoophorectomy Bilateral 04/14/2014    Procedure: Bilateral salpingectomy ;  Surgeon: Linda Hedges, DO;  Location: [facility];  Service: Gynecology;  Laterality: Bilateral;    Family History  Problem Relation Age of Onset  . Breast cancer Maternal Grandmother   . Diabetes Maternal Grandmother   . Other Maternal Grandmother     Blood Cancer  . High blood pressure Maternal Grandfather   . Heart disease Maternal Grandfather   . Prostate cancer Maternal Uncle   . Multiple sclerosis Mother   . Celiac disease Daughter   . Skin cancer Mother   . Irritable bowel syndrome Maternal Grandmother   . Colon polyps Maternal Grandmother   . Colon polyps Maternal Grandfather   . Colon cancer Maternal Uncle     questionable    Allergies  Allergen Reactions  . Penicillins Rash    Can tolerate Amoxicillin    Current Outpatient Prescriptions on File Prior to Visit  Medication Sig Dispense Refill  . ibuprofen (ADVIL,MOTRIN) 600 MG tablet Take 1 tablet (600 mg total) by mouth every 6 (six) hours as needed. 40 tablet 1  . polyethylene glycol (MIRALAX / GLYCOLAX) packet Take 17 g by mouth daily. One  capful    . acetaminophen (TYLENOL) 325 MG tablet Take 650 mg by mouth every 6 (six) hours as needed (pain).    Marland Kitchen omeprazole (PRILOSEC) 20 MG capsule Take 20 mg by mouth daily.    Marland Kitchen oxyCODONE-acetaminophen (PERCOCET/ROXICET) 5-325 MG per tablet Take 1-2 tablets by mouth every 4 (four) hours as needed for severe pain (moderate to severe pain (when tolerating fluids)). (Patient not taking: Reported on 10/08/2014) 40 tablet 0   No current facility-administered medications on file prior to visit.    BP 109/72 mmHg  Pulse 99  Temp(Src) 98.3 F (36.8 C) (Oral)  Ht 5\' 6"  (1.676 m)  Wt 161 lb 9.6 oz (73.301 kg)  BMI 26.10 kg/m2  SpO2 97%  LMP 03/11/2014      Review of Systems  Constitutional: Negative for chills, fatigue and  unexpected weight change.  Respiratory: Positive for shortness of breath. Negative for cough and wheezing.   Cardiovascular: Negative for chest pain and palpitations.  Gastrointestinal: Negative for abdominal pain.  Musculoskeletal: Negative for back pain.       Mild faint left lower rib region pain.  Skin: Negative for rash.  Neurological: Negative for dizziness, tremors, facial asymmetry, speech difficulty, weakness, light-headedness and numbness.  Hematological: Negative for adenopathy. Does not bruise/bleed easily.       Objective:   Physical Exam   General Mental Status- Alert. General Appearance- Not in acute distress.   Skin General: Color- Normal Color. Moisture- Normal Moisture.  Neck Carotid Arteries- Normal color. Moisture- Normal Moisture. No carotid bruits. No JVD.  Chest and Lung Exam Auscultation: Breath Sounds: shallow respiration but clear.  Cardiovascular Auscultation:Rythm- Regular. Murmurs & Other Heart Sounds:Auscultation of the heart reveals- No Murmurs.  Abdomen Inspection:-Inspeection Normal. Palpation/Percussion:Note:No mass. Palpation and Percussion of the abdomen reveal- Non Tender, Non Distended + BS, no rebound or guarding.    Neurologic Cranial Nerve exam:- CN III-XII intact(No nystagmus), symmetric smile. Drift Test:- No drift. Romberg Exam:- Negative.  Heal to Toe Gait exam:-Normal. Finger to Nose:- Normal/Intact Strength:- 5/5 equal and symmetric strength both upper and lower extremities.  Lower ext- no pedal edema. Negative homans signs.        Assessment & Plan:  Post neb lungs clear and deep. O2 sat increase to 99%. She feels like she can take deep breath now. Her resting pulse decreased to 80's. She felt better ambulating. Her puls on walking was 102. Previously on walking went up to 120.

## 2014-10-10 NOTE — Progress Notes (Signed)
Pre visit review using our clinic review tool, if applicable. No additional management support is needed unless otherwise documented below in the visit note. 

## 2014-10-10 NOTE — Patient Instructions (Addendum)
Your shortness of breath is significantly improved per your report after the neb treatment, you may have reactive airways as you have smoked in the past and have history of inhaler use in past when you were sick.   I am giving you prednisone rx, along with qvar steroid inhaler and albuterol inhaler.  If your symptoms worsen again then ED evaluation.  Follow up on wed or as needed.  On follow up if not basically 100% improved I plan on referring you to cardiology and likely pulmonology.  Pt number cell. [phone].  Management and recent work up and presentation discussed with Supervising MD Lowne today.  After reviewing pt labs in the ED. I called pt at home  to see how she was. She stated she was the same as when she left the office. No better or worse. This was despite using qvar and albuterol. She had not used the prednisone yet.   I  discussed with pt the predictive value of the d-dimer. Not 100% accurate. She may be leaving to go out of town by tomorrow afternoon. I advised her to go ahead and take the prednisone. If she does not get significant sustained improvement then go to ED. Definitely if no improvement by tomorrow am then go to ED. I think at that point CT chest may very well be indicated despite negative D dimer. Pt expressed understanding.

## 2014-10-15 ENCOUNTER — Telehealth: Payer: Self-pay | Admitting: Family Medicine

## 2014-10-15 ENCOUNTER — Encounter: Payer: Self-pay | Admitting: Medical

## 2014-10-15 ENCOUNTER — Encounter (HOSPITAL_BASED_OUTPATIENT_CLINIC_OR_DEPARTMENT_OTHER): Payer: Self-pay

## 2014-10-15 ENCOUNTER — Ambulatory Visit (HOSPITAL_BASED_OUTPATIENT_CLINIC_OR_DEPARTMENT_OTHER)
Admission: RE | Admit: 2014-10-15 | Discharge: 2014-10-15 | Disposition: A | Discharge status: Home / Self Care | Payer: BLUE CROSS/BLUE SHIELD | Source: Ambulatory Visit | Attending: Medical | Admitting: Medical

## 2014-10-15 ENCOUNTER — Ambulatory Visit (INDEPENDENT_AMBULATORY_CARE_PROVIDER_SITE_OTHER): Payer: BLUE CROSS/BLUE SHIELD | Admitting: Medical

## 2014-10-15 VITALS — BP 127/78 | HR 94 | Temp 98.6°F | Ht 66.0 in | Wt 160.2 lb

## 2014-10-15 DIAGNOSIS — R0789 Other chest pain: Secondary | ICD-10-CM | POA: Insufficient documentation

## 2014-10-15 DIAGNOSIS — R0600 Dyspnea, unspecified: Secondary | ICD-10-CM | POA: Insufficient documentation

## 2014-10-15 DIAGNOSIS — R0602 Shortness of breath: Secondary | ICD-10-CM | POA: Diagnosis present

## 2014-10-15 DIAGNOSIS — R002 Palpitations: Secondary | ICD-10-CM | POA: Insufficient documentation

## 2014-10-15 DIAGNOSIS — K219 Gastro-esophageal reflux disease without esophagitis: Secondary | ICD-10-CM

## 2014-10-15 DIAGNOSIS — R5383 Other fatigue: Secondary | ICD-10-CM

## 2014-10-15 DIAGNOSIS — R450 Nervousness: Secondary | ICD-10-CM

## 2014-10-15 LAB — TROPONIN I: TNIDX: 0 ug/l (ref 0.00–0.06)

## 2014-10-15 MED ORDER — IOHEXOL 350 MG/ML SOLN
100.0000 mL | Freq: Once | INTRAVENOUS | Status: AC | PRN
Start: 2014-10-15 — End: 2014-10-15
  Administered 2014-10-15: 100 mL via INTRAVENOUS

## 2014-10-15 NOTE — Assessment & Plan Note (Signed)
Pt had some pain with dyspnea on Monday. No pain in chest since except mild transient and on deep inspiration. Ekg normal in office. I did troponin stat today. Not to reflect any process today as she had not constant type pain today. But to evaluate late Monday. This will likely be negative but did order the test. Note when in ED the test was negative.Will go ahead and refer her to cardiology for stress testing.

## 2014-10-15 NOTE — Progress Notes (Signed)
Subjective:    Patient ID: Nicole Perry, female    DOB: Apr 29, 1976, 39 y.o.   MRN: [id number]  HPI   Pt in stating she feels worse overall. She states she only felels mildly better briefly shortly with inhaler and prednisone.   But now reports some heart burn yesterday and today.  Gets very jittery and hot and cold. (Wtih some flushing sensation and feeling mild warm) Pt still feels short of breath.  She still feels mild chest pain intermittently. Monday evening riding home in boat  had some pain radiating to her mid chest  that was moderate. But since then she does not report any pain except for on deep breathing.  Some pain presently now. But today only when she deep breaths.   Also some pain in her left side of her neck radiating to her left ear transiently yesterday.   LMP- hysterectomy in July.   Pt has been seen by me 3 times now for her sob sensation and by the ED as well.  Past Medical History  Diagnosis Date  . ADD (attention deficit disorder)   . GERD (gastroesophageal reflux disease)     History   Social History  . Marital Status: Married    Spouse Name: N/A    Number of Children: 2  . Years of Education: N/A   Occupational History  . Inventory Control    Social History Main Topics  . Smoking status: Former Smoker    Quit date: 04/14/2010  . Smokeless tobacco: Never Used  . Alcohol Use: Yes     Comment: Socially  . Drug Use: No  . Sexual Activity: Not on file   Other Topics Concern  . Not on file   Social History Narrative    Past Surgical History  Procedure Laterality Date  . Appendectomy  1989  . Laparoscopic assisted vaginal hysterectomy N/A 04/14/2014    Procedure: LAPAROSCOPIC ASSISTED VAGINAL HYSTERECTOMY;  Surgeon: Linda Hedges, DO;  Location: [facility];  Service: Gynecology;  Laterality: N/A;  . Salpingoophorectomy Bilateral 04/14/2014    Procedure: Bilateral salpingectomy ;  Surgeon: Linda Hedges, DO;  Location: [facility];  Service:  Gynecology;  Laterality: Bilateral;    Family History  Problem Relation Age of Onset  . Breast cancer Maternal Grandmother   . Diabetes Maternal Grandmother   . Other Maternal Grandmother     Blood Cancer  . High blood pressure Maternal Grandfather   . Heart disease Maternal Grandfather   . Prostate cancer Maternal Uncle   . Multiple sclerosis Mother   . Celiac disease Daughter   . Skin cancer Mother   . Irritable bowel syndrome Maternal Grandmother   . Colon polyps Maternal Grandmother   . Colon polyps Maternal Grandfather   . Colon cancer Maternal Uncle     questionable    Allergies  Allergen Reactions  . Penicillins Rash    Can tolerate Amoxicillin    Current Outpatient Prescriptions on File Prior to Visit  Medication Sig Dispense Refill  . albuterol (PROVENTIL HFA;VENTOLIN HFA) 108 (90 BASE) MCG/ACT inhaler Inhale 2 puffs into the lungs every 6 (six) hours as needed for wheezing or shortness of breath. 1 Inhaler 0  . beclomethasone (QVAR) 40 MCG/ACT inhaler Inhale 2 puffs into the lungs 2 (two) times daily at 10 AM and 5 PM. 1 Inhaler 0  . polyethylene glycol (MIRALAX / GLYCOLAX) packet Take 17 g by mouth daily. One capful    . predniSONE (DELTASONE) 20 MG tablet  1 tab po tid x 5 days (Patient not taking: Reported on 10/15/2014) 15 tablet 0   Current Facility-Administered Medications on File Prior to Visit  Medication Dose Route Frequency Provider Last Rate Last Dose  . ipratropium-albuterol (DUONEB) 0.5-2.5 (3) MG/3ML nebulizer solution 3 mL  3 mL Nebulization Q4H Khyler Eschmann M Kambree Krauss, PA-C        BP 127/78 mmHg  Pulse 94  Temp(Src) 98.6 F (37 C) (Oral)  Ht 5\' 6"  (1.676 m)  Wt 160 lb 3.2 oz (72.666 kg)  BMI 25.87 kg/m2  SpO2 99%  LMP 03/11/2014     Review of Systems  Constitutional: Positive for fatigue. Negative for fever and chills.  HENT: Positive for ear pain. Negative for congestion, postnasal drip, rhinorrhea, sinus pressure, sneezing, sore throat,  tinnitus and voice change.        Transient on and off left ear pain.  Respiratory: Positive for shortness of breath. Negative for cough, choking, chest tightness, wheezing and stridor.   Cardiovascular: Negative for chest pain, palpitations and leg swelling.       Trransient chest wall pain on breathing but not every breath and is on and off.  Gastrointestinal: Negative for abdominal pain.  Musculoskeletal: Negative for back pain.  Hematological: Negative for adenopathy. Does not bruise/bleed easily.  Psychiatric/Behavioral: Negative for hallucinations, behavioral problems, dysphoric mood, decreased concentration and agitation.       But seems little stressed and admitted recently excited easily and her symptoms seemed to worsen at that time.       Objective:   Physical Exam   General  Mental Status - Alert. General Appearance - Well groomed. Not in acute distress.  Skin Rashes- No Rashes.  HEENT Head- Normal. Ear Auditory Canal - Left- Normal. Right - Normal.Tympanic Membrane- Left- Normal. Right- Normal. Eye Sclera/Conjunctiva- Left- Normal. Right- Normal. Nose & Sinuses Nasal Mucosa- Left-  Not boggy or Congested. Right-  Not  boggy or Congested.   Neck Neck- Supple. No Masses. No jvd. No tracheal deviation.   Chest and Lung Exam Auscultation: Breath Sounds:- even and unlabored,   Cardiovascular Auscultation:Rythm- Regular, rate and rhythm. Murmurs & Other Heart Sounds:Ausculatation of the heart reveal- No Murmurs.  Lymphatic Head & Neck General Head & Neck Lymphatics: Bilateral: Description- No Localized lymphadenopathy.  Lower ext- negative homans signs. No pedal edema.   Abdomen Inspection:-Inspeection Normal. Palpation/Percussion:Note:No mass. Palpation and Percussion of the abdomen reveal- Non Tender, Non Distended + BS, no rebound or guarding.    Neurologic Cranial Nerve exam:- CN III-XII intact(No nystagmus), symmetric smile. Romberg Exam:-  Negative.  Heal to Toe Gait exam:-Normal. Finger to Nose:- Normal/Intact Strength:- 5/5 equal and symmetric strength both upper and lower extremities.         Assessment & Plan:

## 2014-10-15 NOTE — Assessment & Plan Note (Signed)
With jittery sensation. Will get tsh today.

## 2014-10-15 NOTE — Assessment & Plan Note (Signed)
Described recent heart burn. Will advise gerd diet and zantrac otc.

## 2014-10-15 NOTE — Telephone Encounter (Signed)
Caller name: Anijah Relation to pt: self  Call back number: [phone] Pharmacy:  Reason for call:   Patient wants to know if Percell Miller would want her to continue inhalers and does she need to take more prednisone until she is referred.

## 2014-10-15 NOTE — Patient Instructions (Addendum)
We got another ekg today. And I decided to get Ct of your chest to rule out PE. Discussed with pulmonologist and they recommended this.  Stay down in radiology until we discuss the results.  Pt notified of negative ct of chest results. Will refer to pulmonology. Continue on inhalers.  Refer to cardiology for stress test eval for atypical chest pain and dyspnea.  Awaiting tsh for fatigue and jitteriness.  Follow up in 2 wks or as needed.

## 2014-10-15 NOTE — Assessment & Plan Note (Addendum)
Got ekg today. And I did get get ct of chest. No PE seen. I will go ahead and refer her to pulmonology during the interim. Continue inhalers.

## 2014-10-15 NOTE — Progress Notes (Signed)
Pre visit review using our clinic review tool, if applicable. No additional management support is needed unless otherwise documented below in the visit note. 

## 2014-10-16 LAB — TSH: TSH: 1.09 u[IU]/mL (ref 0.35–4.50)

## 2014-10-16 NOTE — Telephone Encounter (Signed)
Message to lpn.

## 2014-10-19 ENCOUNTER — Encounter (HOSPITAL_BASED_OUTPATIENT_CLINIC_OR_DEPARTMENT_OTHER): Payer: Self-pay | Admitting: Family Medicine

## 2014-10-19 ENCOUNTER — Emergency Department (HOSPITAL_BASED_OUTPATIENT_CLINIC_OR_DEPARTMENT_OTHER)
Admission: EM | Admit: 2014-10-19 | Discharge: 2014-10-19 | Disposition: A | Discharge status: Home / Self Care | Payer: BLUE CROSS/BLUE SHIELD | Attending: Emergency Medicine | Admitting: Emergency Medicine

## 2014-10-19 DIAGNOSIS — Z8719 Personal history of other diseases of the digestive system: Secondary | ICD-10-CM | POA: Diagnosis not present

## 2014-10-19 DIAGNOSIS — J0100 Acute maxillary sinusitis, unspecified: Secondary | ICD-10-CM | POA: Insufficient documentation

## 2014-10-19 DIAGNOSIS — R519 Headache, unspecified: Secondary | ICD-10-CM

## 2014-10-19 DIAGNOSIS — Z7951 Long term (current) use of inhaled steroids: Secondary | ICD-10-CM | POA: Insufficient documentation

## 2014-10-19 DIAGNOSIS — J029 Acute pharyngitis, unspecified: Secondary | ICD-10-CM | POA: Diagnosis not present

## 2014-10-19 DIAGNOSIS — Z87891 Personal history of nicotine dependence: Secondary | ICD-10-CM | POA: Diagnosis not present

## 2014-10-19 DIAGNOSIS — Z8659 Personal history of other mental and behavioral disorders: Secondary | ICD-10-CM | POA: Insufficient documentation

## 2014-10-19 DIAGNOSIS — Z79899 Other long term (current) drug therapy: Secondary | ICD-10-CM | POA: Diagnosis not present

## 2014-10-19 DIAGNOSIS — Z880 Allergy status to penicillin: Secondary | ICD-10-CM | POA: Diagnosis not present

## 2014-10-19 DIAGNOSIS — R05 Cough: Secondary | ICD-10-CM | POA: Diagnosis present

## 2014-10-19 DIAGNOSIS — R51 Headache: Secondary | ICD-10-CM

## 2014-10-19 LAB — RAPID STREP SCREEN (MED CTR MEBANE ONLY): Streptococcus, Group A Screen (Direct): NEGATIVE

## 2014-10-19 MED ORDER — AZITHROMYCIN 250 MG PO TABS: ORAL_TABLET | ORAL | Status: DC

## 2014-10-19 NOTE — ED Provider Notes (Signed)
CSN: [id number]     Arrival date & time 10/19/14  0804 History   First MD Initiated Contact with Patient 10/19/14 [phone]     Chief Complaint  Patient presents with  . URI     (Consider location/radiation/quality/duration/timing/severity/associated sxs/prior Treatment) HPI Comments: 39 year old female with reflux history and currently being worked up by pulmonology/primary Dr. for intermittent shortness of breath presents with cough, congestion, ear fullness and sore throat for the past week. Patient has tried prednisone and over-the-counter medicines, with inhalers without significant improvement. Patient has had frontal headache for approximately 7-10 days gradual onset. No fevers. Symptoms fairly constant and gradually worsening.  Patient is a 39 y.o. female presenting with URI. The history is provided by the patient.  URI Presenting symptoms: congestion, cough and rhinorrhea   Presenting symptoms: no fever   Associated symptoms: headaches     Past Medical History  Diagnosis Date  . ADD (attention deficit disorder)   . GERD (gastroesophageal reflux disease)    Past Surgical History  Procedure Laterality Date  . Appendectomy  1989  . Laparoscopic assisted vaginal hysterectomy N/A 04/14/2014    Procedure: LAPAROSCOPIC ASSISTED VAGINAL HYSTERECTOMY;  Surgeon: Linda Hedges, DO;  Location: [facility];  Service: Gynecology;  Laterality: N/A;  . Salpingoophorectomy Bilateral 04/14/2014    Procedure: Bilateral salpingectomy ;  Surgeon: Linda Hedges, DO;  Location: [facility];  Service: Gynecology;  Laterality: Bilateral;   Family History  Problem Relation Age of Onset  . Breast cancer Maternal Grandmother   . Diabetes Maternal Grandmother   . Other Maternal Grandmother     Blood Cancer  . High blood pressure Maternal Grandfather   . Heart disease Maternal Grandfather   . Prostate cancer Maternal Uncle   . Multiple sclerosis Mother   . Celiac disease Daughter   . Skin cancer Mother   .  Irritable bowel syndrome Maternal Grandmother   . Colon polyps Maternal Grandmother   . Colon polyps Maternal Grandfather   . Colon cancer Maternal Uncle     questionable   History  Substance Use Topics  . Smoking status: Former Smoker    Quit date: 04/14/2010  . Smokeless tobacco: Never Used  . Alcohol Use: Yes     Comment: Socially   OB History    No data available     Review of Systems  Constitutional: Positive for appetite change. Negative for fever.  HENT: Positive for congestion, rhinorrhea and sinus pressure.   Respiratory: Positive for cough.   Gastrointestinal: Negative for vomiting.  Skin: Negative for rash.  Neurological: Positive for headaches.      Allergies  Penicillins  Home Medications   Prior to Admission medications   Medication Sig Start Date End Date Taking? Authorizing Provider  albuterol (PROVENTIL HFA;VENTOLIN HFA) 108 (90 BASE) MCG/ACT inhaler Inhale 2 puffs into the lungs every 6 (six) hours as needed for wheezing or shortness of breath. 10/10/14   H[place]art, PA-C  azithromycin (ZITHROMAX Z-PAK) 250 MG tablet 2 po day one, then 1 daily x 4 days 10/19/14   Mariea Clonts, MD  beclomethasone (QVAR) 40 MCG/ACT inhaler Inhale 2 puffs into the lungs 2 (two) times daily at 10 AM and 5 PM. 10/10/14   Meriam Sprague Saguier, PA-C  polyethylene glycol (MIRALAX / Floria Raveling) packet Take 17 g by mouth daily. One capful    Historical Provider, MD  predniSONE (DELTASONE) 20 MG tablet 1 tab po tid x 5 days Patient not taking: Reported on 10/15/2014 10/10/14  Meriam Sprague Saguier, PA-C   BP 139/84 mmHg  Pulse 97  Temp(Src) 98.2 F (36.8 C) (Oral)  Resp 18  SpO2 99%  LMP 03/11/2014 Physical Exam  Constitutional: She appears well-developed and well-nourished. No distress.  HENT:  Head: Normocephalic and atraumatic.  Patient with effusion left TM no sign of active ear infection. Neck supple no meningismus mild anterior cervical adenopathy bilateral. Mild erythema  posterior pharynx no significant swelling or exudate. Patient has tenderness\sinuses bilateral.  Eyes: Conjunctivae are normal.  Neck: Normal range of motion. Neck supple.  Cardiovascular: Normal rate and regular rhythm.   Pulmonary/Chest: Effort normal and breath sounds normal.  Nursing note and vitals reviewed.   ED Course  Procedures (including critical care time) Labs Review Labs Reviewed  RAPID STREP SCREEN  CULTURE, GROUP A STREP    Imaging Review No results found.   EKG Interpretation None      MDM   Final diagnoses:  Acute maxillary sinusitis, recurrence not specified  Pharyngitis  Sinus headache   Clinically acute sinus infection. Discussed most likely viral in origin, discussed supportive care. Patient has had prolonged symptoms with headache for approximately 7-10 days. Discussed this is more on indication for antibiotics if no improvement 3-5 days. Discussed giving antibiotic prescription however only to take it if no improvement 3-5 days.  Results and differential diagnosis were discussed with the patient/parent/guardian. Close follow up outpatient was discussed, comfortable with the plan.   Medications - No data to display  Filed Vitals:   10/19/14 0814  BP: 139/84  Pulse: 97  Temp: 98.2 F (36.8 C)  TempSrc: Oral  Resp: 18  SpO2: 99%    Final diagnoses:  Acute maxillary sinusitis, recurrence not specified  Pharyngitis  Sinus headache        Mariea Clonts, MD 10/19/14 [phone]

## 2014-10-19 NOTE — Discharge Instructions (Signed)
Take antibiotics only if no improvement or worsening symptoms in 4-5 days. Perform regular nasal drainage with homemade Netty pot as discussed.  If you were given medicines take as directed.  If you are on coumadin or contraceptives realize their levels and effectiveness is altered by many different medicines.  If you have any reaction (rash, tongues swelling, other) to the medicines stop taking and see a physician.   Please follow up as directed and return to the ER or see a physician for new or worsening symptoms.  Thank you. Filed Vitals:   10/19/14 0814  BP: 139/84  Pulse: 97  Temp: 98.2 F (36.8 C)  TempSrc: Oral  Resp: 18  SpO2: 99%

## 2014-10-19 NOTE — ED Notes (Signed)
Pt c/o nasal congestion, PND and ear "fullness" x 1 wk. Pt sts cough started last night with low grade temp. Taking otc cold meds and using inhalers. Pt has been worked up for shortness of breath and has appt with pulmonologist tomorrow.

## 2014-10-20 ENCOUNTER — Ambulatory Visit (INDEPENDENT_AMBULATORY_CARE_PROVIDER_SITE_OTHER): Payer: BLUE CROSS/BLUE SHIELD | Admitting: Internal Medicine

## 2014-10-20 ENCOUNTER — Encounter: Payer: Self-pay | Admitting: Internal Medicine

## 2014-10-20 VITALS — BP 128/82 | HR 130 | Temp 97.1°F | Ht 65.0 in | Wt 160.6 lb

## 2014-10-20 DIAGNOSIS — R059 Cough, unspecified: Secondary | ICD-10-CM | POA: Insufficient documentation

## 2014-10-20 DIAGNOSIS — R05 Cough: Secondary | ICD-10-CM

## 2014-10-20 DIAGNOSIS — R0600 Dyspnea, unspecified: Secondary | ICD-10-CM

## 2014-10-20 NOTE — Progress Notes (Signed)
Subjective:    Patient ID: Nicole Perry, female    DOB: 12/20/1975  MRN: [id number]  HPI  89 yowf quit smoking  2011 with hx of tendency to bad  strep infections as child and some tendency to "bronchitis" sev times a year as adult  esp in winter and needed albuterol prn starting around 2010  Which did not change with smoking cessation  referred  10/20/2014 to pulmonary clinic by Dr Birdie Riddle  for eval of new onset sob s the usual "bronchitis" pattern    10/20/2014 1st [facility] office visit/ Gema Ringold   Chief Complaint  Patient presents with  . Pulmonary Consult    Referred by Dr. Birdie Riddle. Pt c/o SOB for the past 2.5 wks. She states she is SOB with activity, lying down flat heat and strong smells.   new onset of unprovoked sob lasting up to 30 min at rest // no better with albuterol / but qvar and prednisone did help (maybe 60%)  and last dose steroids 10/14/14  Episode sob  lying down 2 nights prior to ov, used albuterol and didn't help, hfa good technique  Nasal congestion x one week (onset p sob)  75% better since at her worst but still doe x 50 ft walking into office today assoc with  some congested sounding coughing which she minimizes  Does have h/o gerd but no HB so not on meds  Presently on qvar 40 2bid and prn saba   No obvious day to day or daytime variabilty or assoc   cp or chest tightness, subjective wheeze overt   hb symptoms. No unusual exp hx or h/o childhood pna/ asthma or knowledge of premature birth.  Sleeping ok without nocturnal  or early am exacerbation  of respiratory  c/o's or need for noct saba. Also denies any obvious fluctuation of symptoms with weather or environmental changes or other aggravating or alleviating factors except as outlined above   Current Medications, Allergies, Complete Past Medical History, Past Surgical History, Family History, and Social History were reviewed in [employer] record.             Review of Systems    Constitutional: Negative for fever, chills and unexpected weight change.  HENT: Positive for congestion, ear pain, sinus pressure and sore throat. Negative for dental problem, nosebleeds, postnasal drip, rhinorrhea, sneezing, trouble swallowing and voice change.   Eyes: Negative for visual disturbance.  Respiratory: Positive for cough and shortness of breath. Negative for choking.   Cardiovascular: Negative for chest pain and leg swelling.  Gastrointestinal: Negative for vomiting, abdominal pain and diarrhea.  Genitourinary: Negative for difficulty urinating.  Musculoskeletal: Negative for arthralgias.  Skin: Negative for rash.  Neurological: Positive for headaches. Negative for tremors and syncope.  Hematological: Does not bruise/bleed easily.       Objective:   Physical Exam  Tense wf nad with freq throat clearing, intermittent harsh coughing spells   Wt Readings from Last 3 Encounters:  10/20/14 160 lb 9.6 oz (72.848 kg)  10/15/14 160 lb 3.2 oz (72.666 kg)  10/10/14 161 lb 9.6 oz (73.301 kg)    Vital signs reviewed   HEENT: nl dentition, turbinates, and orophanx. Nl external ear canals without cough reflex   NECK :  without JVD/Nodes/TM/ nl carotid upstrokes bilaterally   LUNGS: no acc muscle use, clear to A and P bilaterally without cough on insp or exp maneuvers   CV:  RRR  no s3 or murmur or increase  in P2, no edema   ABD:  soft and nontender with nl excursion in the supine position. No bruits or organomegaly, bowel sounds nl  MS:  warm without deformities, calf tenderness, cyanosis or clubbing  SKIN: warm and dry without lesions    NEURO:  alert, approp, no deficits    CTa  10/15/14 Images reviewed  1. Negative for pulmonary embolus. 2. No findings to explain the patient's symptoms.    Lab Results  Component Value Date   TSH 1.09 10/15/2014       Chemistry      Component Value Date/Time   NA 138 10/08/2014 1730   K 3.4* 10/08/2014 1730   CL 106  10/08/2014 1730   CO2 26 10/08/2014 1730   BUN 14 10/08/2014 1730   CREATININE 0.67 10/08/2014 1730      Component Value Date/Time   CALCIUM 9.4 10/08/2014 1730   ALKPHOS 50 05/24/2013 0824   AST 14 05/24/2013 0824   ALT 14 05/24/2013 0824   BILITOT 0.5 05/24/2013 0824       Lab Results  Component Value Date   WBC 7.5 10/08/2014   HGB 13.7 10/08/2014   HCT 41.0 10/08/2014   MCV 90.1 10/08/2014   PLT 282 10/08/2014         Assessment & Plan:

## 2014-10-20 NOTE — Assessment & Plan Note (Signed)
-   spirometry completely wnl 10/20/2014   Symptoms are markedly disproportionate to objective findings and not clear this is a lung problem but pt does appear to have difficult airway management issues. DDX of  difficult airways management all start with A and  include Adherence, Ace Inhibitors, Acid Reflux, Active Sinus Disease, Alpha 1 Antitripsin deficiency, Anxiety masquerading as Airways dz,  ABPA,  allergy(esp in young), Aspiration (esp in elderly), Adverse effects of DPI,  Active smokers, plus two Bs  = Bronchiectasis and Beta blocker use..and one C= CHF  Adherence is always the initial "prime suspect" and is a multilayered concern that requires a "trust but verify" approach in every patient - starting with knowing how to use medications, especially inhalers, correctly, keeping up with refills and understanding the fundamental difference between maintenance and prns vs those medications only taken for a very short course and then stopped and not refilled.  - The proper method of use, as well as anticipated side effects, of a metered-dose inhaler are discussed and demonstrated to the patient. Improved effectiveness after extensive coaching during this visit to a level of approximately  90% so continue qvar 40 2bid for now but not convinced this is asthma  ? Acid (or non-acid) GERD > always difficult to exclude as up to 75% of pts in some series report no assoc GI/ Heartburn symptoms> rec max (24h)  acid suppression and diet restrictions/ reviewed and instructions given in writing.  ? Anxiety > dx of exclusion but the component that doesn't get better p saba, given her excellent hfa technique, is likely related to anxiety

## 2014-10-20 NOTE — Assessment & Plan Note (Signed)
The most common causes of chronic cough in immunocompetent adults include the following: upper airway cough syndrome (UACS), previously referred to as postnasal drip syndrome (PNDS), which is caused by variety of rhinosinus conditions; (2) asthma; (3) GERD; (4) chronic bronchitis from cigarette smoking or other inhaled environmental irritants; (5) nonasthmatic eosinophilic bronchitis; and (6) bronchiectasis.   These conditions, singly or in combination, have accounted for up to 94% of the causes of chronic cough in prospective studies.   Other conditions have constituted no >6% of the causes in prospective studies These have included bronchogenic carcinoma, chronic interstitial pneumonia, sarcoidosis, left ventricular failure, ACEI-induced cough, and aspiration from a condition associated with pharyngeal dysfunction.    Chronic cough is often simultaneously caused by more than one condition. A single cause has been found from 38 to 82% of the time, multiple causes from 18 to 62%. Multiply caused cough has been the result of three diseases up to 42% of the time.       Based on hx and exam, this is most likely:  Classic Upper airway cough syndrome, so named because it's frequently impossible to sort out how much is  CR/sinusitis with freq throat clearing (which can be related to primary GERD)   vs  causing  secondary (" extra esophageal")  GERD from wide swings in gastric pressure that occur with throat clearing, often  promoting self use of mint and menthol lozenges that reduce the lower esophageal sphincter tone and exacerbate the problem further in a cyclical fashion.   These are the same pts (now being labeled as having "irritable larynx syndrome" by some cough centers) who not infrequently have a history of having failed to tolerate ace inhibitors,  dry powder inhalers or biphosphonates or report having atypical reflux symptoms that don't respond to standard doses of PPI , and are easily confused as  having aecopd or asthma flares by even experienced allergists/ pulmonologists.   The first step is to maximize acid suppression, check sinus ct before more abx, then regroup in 4 weeks and continue on qvar 40 2 bid for now  See instructions for specific recommendations which were reviewed directly with the patient who was given a copy with highlighter outlining the key components.

## 2014-10-20 NOTE — Patient Instructions (Addendum)
qvar 40 2 every 12 hours   Only use your albuterol as a rescue medication to be used if you can't catch your breath by resting or doing a relaxed purse lip breathing pattern.  - The less you use it, the better it will work when you need it. - Ok to use up to 2 puffs  every 4 hours if you must but call for immediate appointment if use goes up over your usual need - Don't leave home without it !!  (think of it like the spare tire for your car)   Omeprazole 20 mg x 2 x 30 min before bfast and pepcid ac 20 mg at bedtime  GERD (REFLUX)  is an extremely common cause of respiratory symptoms just like yours , many times with no obvious heartburn at all.    It can be treated with medication, but also with lifestyle changes including avoidance of late meals, excessive alcohol, smoking cessation, and avoid fatty foods, chocolate, peppermint, colas, red wine, and acidic juices such as orange juice.  NO MINT OR MENTHOL PRODUCTS SO NO COUGH DROPS  USE SUGARLESS CANDY INSTEAD (Jolley ranchers or Stover's or Life Savers) or even ice chips will also do - the key is to swallow to prevent all throat clearing. NO OIL BASED VITAMINS - use powdered substitutes.  Please see patient coordinator before you leave today  to schedule  Sinus CT   Please schedule a follow up office visit in 4 weeks, sooner if needed

## 2014-10-21 LAB — CULTURE, GROUP A STREP

## 2014-10-23 ENCOUNTER — Ambulatory Visit (INDEPENDENT_AMBULATORY_CARE_PROVIDER_SITE_OTHER)
Admission: RE | Admit: 2014-10-23 | Discharge: 2014-10-23 | Disposition: A | Discharge status: Home / Self Care | Payer: BLUE CROSS/BLUE SHIELD | Source: Ambulatory Visit | Attending: Internal Medicine | Admitting: Internal Medicine

## 2014-10-23 ENCOUNTER — Other Ambulatory Visit: Payer: Self-pay | Admitting: Internal Medicine

## 2014-10-23 DIAGNOSIS — R059 Cough, unspecified: Secondary | ICD-10-CM

## 2014-10-23 DIAGNOSIS — R05 Cough: Secondary | ICD-10-CM

## 2014-10-23 MED ORDER — MOXIFLOXACIN HCL 400 MG PO TABS: 400.0000 mg | ORAL_TABLET | Freq: Every day | ORAL | Status: DC

## 2014-10-23 NOTE — Progress Notes (Signed)
Quick Note:  Spoke with pt and notified of results per Dr. Wert. Pt verbalized understanding and denied any questions.  ______ 

## 2014-10-31 ENCOUNTER — Telehealth: Payer: Self-pay | Admitting: Family Medicine

## 2014-10-31 NOTE — Telephone Encounter (Signed)
Pt states her and her family moved into the house about 1 year ago.  There was no mold present at the time of inspection.  Only within the last couple of months, with all the rain and snow, has patient's home been affected by mold.  Recently she had a guy come in, who was not a professional, to inspect her home.  The guy did determine that mold was present, but was unable to identify the type.  Since the middle of January patient has been having issues with her upper respiratory tract.  She has had multiple doctors appointments and has gone to the ER twice for symptoms such as dyspnea, chest pain, upper respiratory infections, sinuitis, cough, etc.  She saw a pulmonologist on 10/20/14 and has an appointment with ENT next week.   She wants to know if there is any way to determine whether mold exposure is causing her symptoms.  Please advise.

## 2014-10-31 NOTE — Telephone Encounter (Signed)
Pt notified and made aware.  No further needs at this time.

## 2014-10-31 NOTE — Telephone Encounter (Signed)
There is not a test to determine the cause of pt's illness.  Typically, illness related to mold exposure improves/resolves w/ removal of mold.  She is seeing the appropriate specialists (ENT, Pulm).  If she wants to the type of mold and the appropriate steps to rid her house of this, she would need to contact the EPA or other Geneticist, molecular that specializes in this sort of work

## 2014-10-31 NOTE — Telephone Encounter (Signed)
Caller name: [name] Relation to pt: self Call back number: [phone] Pharmacy:  Reason for call:   Patient states that she had a professional come out and check for mold at her house. They did find mold and she wants to know if this could be associated with the health problems that she has been having? Nicole Perry for this but is requesting that Nicole Billow T. Call her back.

## 2014-10-31 NOTE — Telephone Encounter (Signed)
Can you find out what medical problems she is referring to?

## 2014-11-05 ENCOUNTER — Encounter: Payer: Self-pay | Admitting: Interventional Cardiology

## 2014-11-05 ENCOUNTER — Ambulatory Visit (INDEPENDENT_AMBULATORY_CARE_PROVIDER_SITE_OTHER): Payer: BLUE CROSS/BLUE SHIELD | Admitting: Interventional Cardiology

## 2014-11-05 ENCOUNTER — Ambulatory Visit: Payer: BLUE CROSS/BLUE SHIELD | Admitting: Cardiology

## 2014-11-05 VITALS — BP 115/72 | HR 103 | Ht 65.0 in | Wt 166.2 lb

## 2014-11-05 DIAGNOSIS — R0789 Other chest pain: Secondary | ICD-10-CM

## 2014-11-05 DIAGNOSIS — R0600 Dyspnea, unspecified: Secondary | ICD-10-CM

## 2014-11-05 LAB — BRAIN NATRIURETIC PEPTIDE: Pro B Natriuretic peptide (BNP): 15 pg/mL (ref 0.0–100.0)

## 2014-11-05 NOTE — Progress Notes (Signed)
Patient ID: Nicole Perry, female   DOB: 1976-01-04, 39 y.o.   MRN: [id number] .    Cardiology Office Note   Date:  11/05/2014   ID:  Tessa, [place]ab[place] 1975/09/28, MRN [id number]  PCP:  Annye Asa, MD  Cardiologist:  Reola Calkins Radford Pax)    Chest pain and SOB.     History of Present Illness: Nicole Perry is a 39 y.o. female with a history of remote tobacco abuse, GERD, and ADD who presented today as a new patient for evaluation of SOB and chest pain.   She has been seen multiple times but pulmonology/primary care/ED for intermittent shortness of breath, chest pain and cough. She was recently seen by Dr. Melvyn Novas for SOB and chronic cough and diagnosed with post nasal drip/reflux related cough. To date her diagnostic work up has been unrevealing. CTA neg for PE. Troponin neg, D-dimer negative, TSH normal. CXR normal. ECG normal. Spirometry normal. She has been trailed on multiple different antibiotics, inhalers and steroids with no improvement of her SOB.   She was in her usual state of health until about 6 weeks ago when she started to note shortness of breath. She takes voice lessons and couldn't get her breath and would feel hot and flushed during breathing exercises. This has gotten progressively worse and she started waking up at night with gasping for air, although this only occurred 2X. Last time this happened was about 2 weeks ago. She does note chronic orthopnea. She then became sick with an acute sinus infection which was confirmed by head CT. This then turned into a post nasal drip related cough. Also noted tachycardia with HRs 100-110s. Baseline HR used to be in the 70s-80s.  She has also had chest pain under her left breast. She was tried on prednisone and inhalers which did seem to help. She has also had a tightness/heaviness in her upper chest. No radiation or associated nausea, diaphoresis or SOB. Not related to exertion. She does get extremely SOB with exertion and sometimes  SOB with simply talking. This has limited her ability to exercise.   She denies a hx of DM, HLD, HTN. She smoked remotely for 3-5 years and was a very light smoker. Her maternal grandfather had MI in early 84s. Maternal great grandmother had CHF. 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 Also had mold in her house recently and having to live at a hotel with her daughter.    Past Medical History  Diagnosis Date  . ADD (attention deficit disorder)   . GERD (gastroesophageal reflux disease)     Past Surgical History  Procedure Laterality Date  . Appendectomy  1989  . Laparoscopic assisted vaginal hysterectomy N/A 04/14/2014    Procedure: LAPAROSCOPIC ASSISTED VAGINAL HYSTERECTOMY;  Surgeon: Linda Hedges, DO;  Location: [facility];  Service: Gynecology;  Laterality: N/A;  . Salpingoophorectomy Bilateral 04/14/2014    Procedure: Bilateral salpingectomy ;  Surgeon: Linda Hedges, DO;  Location: [facility];  Service: Gynecology;  Laterality: Bilateral;     Current Outpatient Prescriptions  Medication Sig Dispense Refill  . albuterol (PROVENTIL HFA;VENTOLIN HFA) 108 (90 BASE) MCG/ACT inhaler Inhale 2 puffs into the lungs every 6 (six) hours as needed for wheezing or shortness of breath. 1 Inhaler 0  . beclomethasone (QVAR) 40 MCG/ACT inhaler Inhale 2 puffs into the lungs 2 (two) times daily  at 10 AM and 5 PM. 1 Inhaler 0  . polyethylene glycol (MIRALAX / GLYCOLAX) packet Take 17 g by mouth daily. One capful     No current facility-administered medications for this visit.    Allergies:   Penicillins    Social History:  The patient  reports that she quit smoking about 4 years ago. Her smoking use included Cigarettes. She has a 3.75 pack-year smoking history. She has never used smokeless tobacco. She reports that she drinks alcohol. She reports that she does not use illicit drugs.   Family  History:  The patient's family history includes Breast cancer in her maternal grandmother; Celiac disease in her daughter; Colon cancer in her maternal uncle; Colon polyps in her maternal grandfather and maternal grandmother; Diabetes in her maternal grandmother; Heart attack in her maternal grandfather; Heart disease in her maternal grandfather; High blood pressure in her maternal grandfather; Irritable bowel syndrome in her maternal grandmother; Multiple sclerosis in her mother; Other in her maternal grandmother; Prostate cancer in her maternal uncle; Skin cancer in her mother.    ROS:  Please see the history of present illness.   Otherwise, review of systems are positive for  Excessive fatigue, HAs, dizziness. All other systems are reviewed and negative.    PHYSICAL EXAM: VS:  BP 115/72 mmHg  Pulse 103  Ht 5\' 5"  (1.651 m)  Wt 166 lb 3.2 oz (75.388 kg)  BMI 27.66 kg/m2  SpO2 98%  LMP 03/11/2014 , BMI Body mass index is 27.66 kg/(m^2). GEN: Well nourished, well developed, in no acute distress HEENT: normal Neck: no JVD, carotid bruits, or masses Cardiac: RRR; no murmurs, rubs, or gallops,no edema  Respiratory:  clear to auscultation bilaterally, normal work of breathing GI: soft, nontender, nondistended, + BS MS: no deformity or atrophy Skin: warm and dry, no rash Neuro:  Strength and sensation are intact Psych: euthymic mood, full affect   EKG:  EKG is ordered today. The ekg ordered today demonstrates NSR, RAD   Recent Labs: 10/08/2014: BUN 14; Creatinine 0.67; Hemoglobin 13.7; Platelets 282; Potassium 3.4*; Sodium 138 10/15/2014: TSH 1.09    Lipid Panel    Component Value Date/Time   CHOL 161 05/24/2013 0824   TRIG 47.0 05/24/2013 0824   HDL 61.40 05/24/2013 0824   CHOLHDL 3 05/24/2013 0824   VLDL 9.4 05/24/2013 0824   LDLCALC 90 05/24/2013 0824      Wt Readings from Last 3 Encounters:  11/05/14 166 lb 3.2 oz (75.388 kg)  10/20/14 160 lb 9.6 oz (72.848 kg)  10/15/14  160 lb 3.2 oz (72.666 kg)      Other studies Reviewed: Additional studies/ records that were reviewed today include: CT Angio chest. CT maxillary sinus Review of the above records demonstrates: Neg for PE. CT of head revealed acute sinus infection.    ASSESSMENT AND PLAN:  Nicole Perry is a 39 y.o. female with a history of tobacco abuse, GERD, and ADD who presented today as a new patient for evaluation of SOB and chest pain.   She has been seen multiple times but pulmonology/primary care/ED for intermittent shortness of breath, chest pain and cough. She was recently seen by Dr. Melvyn Novas for SOB and chronic cough and diagnosed with post nasal drip/reflux related cough. To date her diagnostic work up has been unrevealing. CTA neg for PE. Troponin neg, D-dimer negative, TSH normal. CXR normal. ECG normal. Spirometry normal. She has been trailed on multiple different antibiotics, inhalers and steroids with no improvement  of her SOB.    SOB with exertion and atypical chest pain- Will order a BNP as this has not been evaluated and proceed with GXT and 2D ECHO. She has very few RFs for CAD and doubt CHF as she appears evolemic. There may be an anxiety component here but need to r/o cardiac etiology. She should continue inhalers and pulm/fam med follow up.   Current medicines are reviewed at length with the patient today.  The patient does not have concerns regarding medicines.  The following changes have been made:  no change  Labs/ tests ordered today include:   Orders Placed This Encounter  Procedures  . B Nat Peptide  . EKG 12-Lead  . Exercise tolerance test  . 2D Echocardiogram without contrast     Disposition:   FU with Dr. Heron Nay in 4-6 weeks unless ETT or ECHO return abnormal.     Signed, Eileen Stanford, PA-C  11/05/2014 9:50 AM    [facility] [place], [place], [place]  [address] Phone: [phone]; Fax: [phone]   I have examined  the patient and reviewed assessment and plan and discussed with patient.  Agree with above as stated.        Reason for ConsultationSHOB, chest pain  HPI: 39 year old woman with shortness of breath and chest discomfort as outlined above. She has had an extensive workup. She is here for cardiac evaluation.   Current Outpatient Prescriptions  Medication Sig Dispense Refill  . albuterol (PROVENTIL HFA;VENTOLIN HFA) 108 (90 BASE) MCG/ACT inhaler Inhale 2 puffs into the lungs every 6 (six) hours as needed for wheezing or shortness of breath. 1 Inhaler 0  . beclomethasone (QVAR) 40 MCG/ACT inhaler Inhale 2 puffs into the lungs 2 (two) times daily at 10 AM and 5 PM. 1 Inhaler 0  . polyethylene glycol (MIRALAX / GLYCOLAX) packet Take 17 g by mouth daily. One capful     No current facility-administered medications for this visit.   Past Medical History  Diagnosis Date  . ADD (attention deficit disorder)   . GERD (gastroesophageal reflux disease)     Family History  Problem Relation Age of Onset  . Breast cancer Maternal Grandmother   . Diabetes Maternal Grandmother   . Other Maternal Grandmother     Blood Cancer  . High blood pressure Maternal Grandfather   . Heart disease Maternal Grandfather   . Prostate cancer Maternal Uncle   . Multiple sclerosis Mother   . Celiac disease Daughter   . Skin cancer Mother   . Irritable bowel syndrome Maternal Grandmother   . Colon polyps Maternal Grandmother   . Colon polyps Maternal Grandfather   . Colon cancer Maternal Uncle     questionable  . Heart attack Maternal Grandfather     in early 87s.     History   Social History  . Marital Status: Married    Spouse Name: N/A  . Number of Children: 2  . Years of Education: N/A   Occupational History  . Inventory Control    Social History Main Topics  . Smoking status: Former Smoker -- 0.25 packs/day for 15 years    Types: Cigarettes    Quit date: 04/14/2010  . Smokeless tobacco: Never  Used  . Alcohol Use: 0.0 oz/week    0 Standard drinks or equivalent per week     Comment: Socially  . Drug Use: No  . Sexual Activity: Not on file   Other Topics  Concern  . Not on file   Social History Narrative    Past Surgical History  Procedure Laterality Date  . Appendectomy  1989  . Laparoscopic assisted vaginal hysterectomy N/A 04/14/2014    Procedure: LAPAROSCOPIC ASSISTED VAGINAL HYSTERECTOMY;  Surgeon: Linda Hedges, DO;  Location: [facility];  Service: Gynecology;  Laterality: N/A;  . Salpingoophorectomy Bilateral 04/14/2014    Procedure: Bilateral salpingectomy ;  Surgeon: Linda Hedges, DO;  Location: [facility];  Service: Gynecology;  Laterality: Bilateral;      (Not in a hospital admission)  Review of systems complete and found to be negative unless listed above .  No nausea, vomiting.  No fever chills, No focal weakness,  No palpitations.  Physical Exam: Filed Vitals:   11/05/14 0840  BP: 115/72  Pulse: 103    Weight: 166 lb 3.2 oz (75.388 kg)  Physical exam:  /AT EOMI No JVD, No carotid bruit RRR S1S2 borderline tachycardia No wheezing Soft. NT, nondistended No edema. No focal motor or sensory deficits Normal affect  Labs:   Lab Results  Component Value Date   WBC 7.5 10/08/2014   HGB 13.7 10/08/2014   HCT 41.0 10/08/2014   MCV 90.1 10/08/2014   PLT 282 10/08/2014   No results for input(s): NA, K, CL, CO2, BUN, CREATININE, CALCIUM, PROT, BILITOT, ALKPHOS, ALT, AST, GLUCOSE in the last 168 hours.  Invalid input(s): LABALBU Lab Results  Component Value Date   TROPONINI <0.03 10/08/2014    Lab Results  Component Value Date   CHOL 161 05/24/2013   Lab Results  Component Value Date   HDL 61.40 05/24/2013   Lab Results  Component Value Date   LDLCALC 90 05/24/2013   Lab Results  Component Value Date   TRIG 47.0 05/24/2013   Lab Results  Component Value Date   CHOLHDL 3 05/24/2013   No results found for: LDLDIRECT    Radiology: as  outlined above [id number]  ASSESSMENT AND PLAN: SHOB, Atypical chest pain  Check Echo and ETT to look for cardiac etiology.  Check BNP to check for fluid overload.  No gross fluid overload noted on exam.    OK to continue her pulmonary meds at this time,   Signed:   Mina Marble, MD, [facility] 11/05/2014, 1:01 PM

## 2014-11-05 NOTE — Patient Instructions (Addendum)
Your physician recommends that you continue on your current medications as directed. Please refer to the Current Medication list given to you today.  Your physician recommends that you get labs today. BNP  Your physician has requested that you have an echocardiogram. Echocardiography is a painless test that uses sound waves to create images of your heart. It provides your doctor with information about the size and shape of your heart and how well your heart's chambers and valves are working. This procedure takes approximately one hour. There are no restrictions for this procedure.  Your physician has requested that you have en exercise stress test.  Your physician recommends that you schedule a follow-up appointment in: 4 to 6 weeks with Dr. Radford Pax.

## 2014-11-10 ENCOUNTER — Other Ambulatory Visit (HOSPITAL_COMMUNITY): Payer: BLUE CROSS/BLUE SHIELD

## 2014-11-12 ENCOUNTER — Encounter: Payer: Self-pay | Admitting: Nurse Practitioner

## 2014-11-12 ENCOUNTER — Ambulatory Visit (HOSPITAL_COMMUNITY): Payer: BLUE CROSS/BLUE SHIELD | Attending: Physician Assistant

## 2014-11-12 ENCOUNTER — Ambulatory Visit (INDEPENDENT_AMBULATORY_CARE_PROVIDER_SITE_OTHER): Payer: BLUE CROSS/BLUE SHIELD | Admitting: Nurse Practitioner

## 2014-11-12 VITALS — BP 122/75 | HR 91

## 2014-11-12 DIAGNOSIS — R0600 Dyspnea, unspecified: Secondary | ICD-10-CM | POA: Diagnosis present

## 2014-11-12 NOTE — Progress Notes (Signed)
2D Echo completed. 11/12/2014

## 2014-11-12 NOTE — Progress Notes (Signed)
Exercise Treadmill Test  Pre-Exercise Testing Evaluation Rhythm: sinus tachycardia  Rate: 93 bpm     Test  Exercise Tolerance Test Ordering MD: Casandra Doffing, MD  Interpreting MD: Truitt Merle, NP  Unique Test No: 1  Treadmill:  1  Indication for ETT: exertional dyspnea  Contraindication to ETT: No   Stress Modality: exercise - treadmill  Cardiac Imaging Performed: non   Protocol: standard Bruce - maximal  Max BP:  154/82  Max MPHR (bpm):  180 85% MPR (bpm):  153  MPHR obtained (bpm):  157 % MPHR obtained:  87%  Reached 85% MPHR (min:sec):  6:11 Total Exercise Time (min-sec):  9 minutes  Workload in METS:  10.1 Borg Scale: 14  Reason ETT Terminated:  desired heart rate attained    ST Segment Analysis At Rest: normal ST segments - no evidence of significant ST depression With Exercise: no evidence of significant ST depression  Other Information Arrhythmia:  No Angina during ETT:  absent (0) Quality of ETT:  diagnostic  ETT Interpretation:  normal - no evidence of ischemia by ST analysis  Comments: Patient presents today for routine GXT. Has had 7 week history of dyspnea and tachycardia as well as episodes of chest pain - has had abrupt onset at night. Former smoker. For echo later today. Extensive pulmonary work up noted.  Today the patient exercised on the standard Bruce protocol for a total of 9 minutes.  Good exercise tolerance.  Adequate blood pressure response.  Clinically negative for chest pain. Test was stopped due to achievement of target HR and fatigue. She was short of breath but oxygen sats were > 92% for duration of study.  EKG negative for ischemia. No significant arrhythmia noted.   Recommendations: Proceed with echo.  See Dr. Radford Pax back as planned.  Patient is agreeable to this plan and will call if any problems develop in the interim.   Burtis Junes, RN, [facility] [address] [place], [place]   [address] [phone]

## 2014-11-14 ENCOUNTER — Telehealth: Payer: Self-pay | Admitting: Internal Medicine

## 2014-11-14 MED ORDER — BECLOMETHASONE DIPROPIONATE 40 MCG/ACT IN AERS: 2.0000 | INHALATION_SPRAY | Freq: Two times a day (BID) | RESPIRATORY_TRACT | Status: DC

## 2014-11-14 NOTE — Telephone Encounter (Signed)
Called and spoke with pt and she is aware of refill of the qvar that has been sent to the pharmacy.  This has been sent to the pharmacy and nothing further is needed.

## 2014-11-17 ENCOUNTER — Encounter: Payer: Self-pay | Admitting: Internal Medicine

## 2014-11-17 ENCOUNTER — Ambulatory Visit (INDEPENDENT_AMBULATORY_CARE_PROVIDER_SITE_OTHER): Payer: BLUE CROSS/BLUE SHIELD | Admitting: Internal Medicine

## 2014-11-17 VITALS — BP 112/70 | HR 90 | Ht 65.0 in | Wt 164.0 lb

## 2014-11-17 DIAGNOSIS — R0600 Dyspnea, unspecified: Secondary | ICD-10-CM

## 2014-11-17 DIAGNOSIS — R059 Cough, unspecified: Secondary | ICD-10-CM

## 2014-11-17 DIAGNOSIS — R05 Cough: Secondary | ICD-10-CM | POA: Diagnosis not present

## 2014-11-17 NOTE — Progress Notes (Signed)
Subjective:    Patient ID: Nicole Perry, female    DOB: [birth year]  MRN: [id number]    Brief patient profile:  51 yowf quit smoking  2011 with hx of tendency to bad  strep infections as child and some tendency to "bronchitis" sev times a year as adult  esp in winter and needed albuterol prn starting around 2010  Which did not change with smoking cessation  referred  10/20/2014 to pulmonary clinic by Dr Birdie Riddle  for eval of new onset sob s the usual "bronchitis" pattern   History of Present Illness  10/20/2014 1st [facility] office visit/ Nicole Perry   Chief Complaint  Patient presents with  . Pulmonary Consult    Referred by Dr. Birdie Riddle. Pt c/o SOB for the past 2.5 wks. She states she is SOB with activity, lying down flat heat and strong smells.   new onset of unprovoked sob lasting up to 30 min at rest // no better with albuterol / but qvar and prednisone did help (maybe 60%)  and last dose steroids 10/14/14  Episode sob  lying down 2 nights prior to ov, used albuterol and didn't help, hfa good technique  Nasal congestion x one week (onset p sob)  75% better since at her worst but still doe x 50 ft walking into office today assoc with  some congested sounding coughing which she minimizes  Does have h/o gerd but no HB so not on meds  Presently on qvar 40 2bid and prn saba  rec qvar 40 2 every 12 hours trial  Only use your albuterol as a rescue medication Omeprazole 20 mg x 2 x 30 min before bfast and pepcid ac 20 mg at bedtime GERD diet . Please see patient coordinator before you leave today  to schedule  Sinus CT  > pos sinusitis > rx avelox and ent referral      11/17/2014 f/u ov/Nicole Perry re:  Chief Complaint  Patient presents with  . Follow-up    Pt states that her breathing is unchanged since the last visit.   sleeping ok / breathing spells last from   30 sec to a minute then  Resolve s intervention   Doe x since first of 1st of the year 2016 / not proportionate to activity    No obvious patterns in day to day or daytime variabilty or assoc chronic cough or cp or chest tightness, subjective wheeze  or hb symptoms. No unusual exp hx or h/o childhood pna/ asthma or knowledge of premature birth.  Sleeping ok without nocturnal  or early am exacerbation  of respiratory  c/o's or need for noct saba. Also denies any obvious fluctuation of symptoms with weather or environmental changes or other aggravating or alleviating factors except as outlined above   Current Medications, Allergies, Complete Past Medical History, Past Surgical History, Family History, and Social History were reviewed in [employer] record.  ROS  The following are not active complaints unless bolded sore throat, dysphagia, dental problems, itching, sneezing,  nasal congestion or excess/ purulent secretions, ear ache,   fever, chills, sweats, unintended wt loss, pleuritic or exertional cp, hemoptysis,  orthopnea pnd or leg swelling, presyncope, palpitations, heartburn, abdominal pain, anorexia, nausea, vomiting, diarrhea  or change in bowel or urinary habits, change in stools or urine, dysuria,hematuria,  rash, arthralgias, visual complaints, headache, numbness weakness or ataxia or problems with walking or coordination,  change in mood/affect or memory.  Objective:   Physical Exam  amb wf nad / no sign throat clearing    11/17/2014        164  Wt Readings from Last 3 Encounters:  10/20/14 160 lb 9.6 oz (72.848 kg)  10/15/14 160 lb 3.2 oz (72.666 kg)  10/10/14 161 lb 9.6 oz (73.301 kg)    Vital signs reviewed   HEENT: nl dentition, turbinates, and orophanx. Nl external ear canals without cough reflex   NECK :  without JVD/Nodes/TM/ nl carotid upstrokes bilaterally   LUNGS: no acc muscle use, clear to A and P bilaterally without cough on insp or exp maneuvers   CV:  RRR  no s3 or murmur or increase in P2, no edema   ABD:  soft  and nontender with nl excursion in the supine position. No bruits or organomegaly, bowel sounds nl  MS:  warm without deformities, calf tenderness, cyanosis or clubbing  SKIN: warm and dry without lesions    NEURO:  alert, approp, no deficits    CTa  10/15/14 Images reviewed  1. Negative for pulmonary embolus. 2. No findings to explain the patient's symptoms.    Lab Results  Component Value Date   TSH 1.09 10/15/2014       Chemistry      Component Value Date/Time   NA 138 10/08/2014 1730   K 3.4* 10/08/2014 1730   CL 106 10/08/2014 1730   CO2 26 10/08/2014 1730   BUN 14 10/08/2014 1730   CREATININE 0.67 10/08/2014 1730      Component Value Date/Time   CALCIUM 9.4 10/08/2014 1730   ALKPHOS 50 05/24/2013 0824   AST 14 05/24/2013 0824   ALT 14 05/24/2013 0824   BILITOT 0.5 05/24/2013 0824       Lab Results  Component Value Date   WBC 7.5 10/08/2014   HGB 13.7 10/08/2014   HCT 41.0 10/08/2014   MCV 90.1 10/08/2014   PLT 282 10/08/2014         Assessment & Plan:

## 2014-11-17 NOTE — Patient Instructions (Signed)
To get the most out of exercise, you need to be continuously aware that you are short of breath, but never out of breath, for 30 minutes daily. As you improve, it will actually be easier for you to do the same amount of exercise  in  30 minutes so always push to the level where you are short of breath.   Return to clinic after sinuses have been cleared if you are still having problems with cough or breathing

## 2014-11-20 ENCOUNTER — Encounter: Payer: Self-pay | Admitting: Internal Medicine

## 2014-11-20 NOTE — Assessment & Plan Note (Addendum)
-   Sinus CT 10/23/2014 > severe acute and chronic sinusitis > avelox 400 mg daily x 10 days and ENT eval    Pulmonary f/u prn -  The only issue is whether needs longterm qvar > would consider trial off once the sinus issue is resolved as may have sinobronchial reflex (though doubt it) from sinusitis causing low grade asthma or at least a perception she needs saba which apparently is better on qvar for now

## 2014-11-20 NOTE — Assessment & Plan Note (Signed)
-   spirometry completely wnl 10/20/2014   Not better on qvar and pattern of resting sob that never occurs sleeping, not proportionate to exertion, resolves w/in one min s doing anything are all classic features of anxiety/ panic disorder, always a dx of exclusion as is the case here (all other likely dx's ruled out)  I had an extended discussion with the patient reviewing all relevant studies completed to date and  lasting 15 to 20 minutes of a 25 minute visit on the following ongoing concerns:  1) discussed control of breathing issues  2) reviewed options for pace exercise. See instructions for specific recommendations which were reviewed directly with the patient who was given a copy with highlighter outlining the key components.    Pulmonary f/u is prn

## 2014-12-11 ENCOUNTER — Ambulatory Visit: Payer: BLUE CROSS/BLUE SHIELD | Admitting: Cardiology

## 2014-12-25 ENCOUNTER — Telehealth: Payer: Self-pay | Admitting: Family Medicine

## 2014-12-25 NOTE — Telephone Encounter (Signed)
Pre Visit letter sent  °

## 2015-01-14 ENCOUNTER — Telehealth: Payer: Self-pay

## 2015-01-14 NOTE — Telephone Encounter (Signed)
Pre visit call completed 

## 2015-01-15 ENCOUNTER — Encounter: Payer: Self-pay | Admitting: Family Medicine

## 2015-01-15 DIAGNOSIS — Z0289 Encounter for other administrative examinations: Secondary | ICD-10-CM

## 2015-01-16 ENCOUNTER — Encounter: Payer: Self-pay | Admitting: Family Medicine

## 2015-01-16 ENCOUNTER — Telehealth: Payer: Self-pay | Admitting: Family Medicine

## 2015-01-16 NOTE — Telephone Encounter (Signed)
Pt was no show for CPE on 01/15/15- letter sent- charge?

## 2015-01-16 NOTE — Telephone Encounter (Signed)
Yes- needs no show fee.  Pt even participated in pre-visit phone call prior to appt.  I have never seen pt, this was her new to establish.  If she has another no-show or cancellation, she will not be able to establish.

## 2015-02-11 ENCOUNTER — Encounter: Payer: Self-pay | Admitting: Family Medicine

## 2015-02-11 ENCOUNTER — Ambulatory Visit (INDEPENDENT_AMBULATORY_CARE_PROVIDER_SITE_OTHER): Payer: BLUE CROSS/BLUE SHIELD | Admitting: Family Medicine

## 2015-02-11 VITALS — BP 120/78 | HR 104 | Temp 98.2°F | Resp 16 | Ht 66.0 in | Wt 171.2 lb

## 2015-02-11 DIAGNOSIS — G4700 Insomnia, unspecified: Secondary | ICD-10-CM | POA: Diagnosis not present

## 2015-02-11 DIAGNOSIS — M2550 Pain in unspecified joint: Secondary | ICD-10-CM | POA: Diagnosis not present

## 2015-02-11 DIAGNOSIS — F411 Generalized anxiety disorder: Secondary | ICD-10-CM | POA: Diagnosis not present

## 2015-02-11 MED ORDER — OMEPRAZOLE 40 MG PO CPDR: 40.0000 mg | DELAYED_RELEASE_CAPSULE | Freq: Every day | ORAL | Status: DC

## 2015-02-11 MED ORDER — POLYETHYLENE GLYCOL 3350 17 G PO PACK: 17.0000 g | PACK | Freq: Every day | ORAL | Status: DC

## 2015-02-11 MED ORDER — DULOXETINE HCL 20 MG PO CPEP: 20.0000 mg | ORAL_CAPSULE | Freq: Every day | ORAL | Status: DC

## 2015-02-11 NOTE — Assessment & Plan Note (Signed)
New.  Pt admits 'nearly debilitating anxiety' recently.  Given her cluster of symptoms and recent extensive yet normal work ups, that she has at least a component of fibromyalgia syndrome.  Based on this, will start low dose Cymbalta to treat both pain and anxiety.  Encouraged her to find a stress outlet such as exercise.  Reviewed dx and tx plan w/ pt.  Pt expressed understanding and is in agreement w/ plan.

## 2015-02-11 NOTE — Patient Instructions (Signed)
Follow up in 4 weeks to recheck pain and mood Start the Cymbalta 20mg  daily in the AM Start OTC melatonin 1mg  prior to bed Try and exercise as both a stress outlet and to prevent muscle soreness Call with any questions or concerns Hang in there!  We're going to figure this out!

## 2015-02-11 NOTE — Progress Notes (Signed)
   Subjective:    Patient ID: Nicole Perry, female    DOB: 02-26-1976, 39 y.o.   MRN: [id number]  HPI New to establish w/ me.  Previously seeing Dr Etter Sjogren (2014).  GYN- Dr Lynnette Caffey (GYN)  GI- [facility]    SOB- pt was seen multiple times in January/Feb for SOB.  She has had normal cardiology workup, normal pulmonary w/u- negative CT scan, normal ECHO.  Pt was told that it may be reflux related but doesn't note an improvement on PPI.  Pt reports air hunger 'not true shortness of breath'.  Pt reports she has recently developed anxiety and 'it can be debilitating'.  sxs started w/ a panic attack that woke pt from sleep.  No recent 'true panic attacks'.  Pt complains of associated joint and muscle aches.  Neck pain 'almost all the time since this started'.  Pt reports poor sleep at night.  Near constant back pain.   Review of Systems For ROS see HPI     Objective:   Physical Exam  Constitutional: She is oriented to person, place, and time. She appears well-developed and well-nourished. No distress.  HENT:  Head: Normocephalic and atraumatic.  Eyes: Conjunctivae and EOM are normal. Pupils are equal, round, and reactive to light.  Neck: Normal range of motion. Neck supple. No thyromegaly present.  Cardiovascular: Normal rate, regular rhythm, normal heart sounds and intact distal pulses.   No murmur heard. Pulmonary/Chest: Effort normal and breath sounds normal. No respiratory distress.  Abdominal: Soft. She exhibits no distension. There is no tenderness.  Musculoskeletal: She exhibits tenderness (TTP over multiple trigger points). She exhibits no edema.  Lymphadenopathy:    She has no cervical adenopathy.  Neurological: She is alert and oriented to person, place, and time.  Skin: Skin is warm and dry.  Psychiatric: She has a normal mood and affect. Her behavior is normal.  Vitals reviewed.         Assessment & Plan:

## 2015-02-11 NOTE — Assessment & Plan Note (Signed)
New.  Pt now having migratory muscle aches and pain.  Again, this fits w/ fibromyalgia symptoms.  Start low dose cymbalta, regular exercise , and monitor for improvement.  Pt expressed understanding and is in agreement w/ plan.

## 2015-02-11 NOTE — Assessment & Plan Note (Signed)
New to provider, ongoing for pt.  This is likely contributing to her pain and anxiety.  Discussed trazodone but pt is interested in a more natural approach and wants to start melatonin.  If no improvement in sleep by next OV will start medication.  Pt expressed understanding and is in agreement w/ plan.

## 2015-02-11 NOTE — Progress Notes (Signed)
Pre visit review using our clinic review tool, if applicable. No additional management support is needed unless otherwise documented below in the visit note. 

## 2015-03-09 ENCOUNTER — Ambulatory Visit: Payer: BLUE CROSS/BLUE SHIELD | Admitting: Family Medicine

## 2015-03-09 DIAGNOSIS — Z0289 Encounter for other administrative examinations: Secondary | ICD-10-CM

## 2015-03-10 ENCOUNTER — Telehealth: Payer: Self-pay | Admitting: Family Medicine

## 2015-03-10 NOTE — Telephone Encounter (Signed)
PT called same day to cancel/ reschedule her appt on 03/09/15. Charge?

## 2015-03-17 ENCOUNTER — Other Ambulatory Visit: Payer: Self-pay | Admitting: Obstetrics & Gynecology

## 2015-03-18 LAB — CYTOLOGY - PAP

## 2015-03-18 NOTE — Telephone Encounter (Signed)
Yes- pt needs a charge

## 2015-03-20 ENCOUNTER — Other Ambulatory Visit: Payer: Self-pay | Admitting: Obstetrics & Gynecology

## 2015-03-20 DIAGNOSIS — R928 Other abnormal and inconclusive findings on diagnostic imaging of breast: Secondary | ICD-10-CM

## 2015-03-25 ENCOUNTER — Ambulatory Visit
Admission: RE | Admit: 2015-03-25 | Discharge: 2015-03-25 | Disposition: A | Discharge status: Home / Self Care | Payer: BLUE CROSS/BLUE SHIELD | Source: Ambulatory Visit | Attending: Obstetrics & Gynecology | Admitting: Obstetrics & Gynecology

## 2015-03-25 DIAGNOSIS — R928 Other abnormal and inconclusive findings on diagnostic imaging of breast: Secondary | ICD-10-CM

## 2015-04-14 ENCOUNTER — Encounter: Payer: Self-pay | Admitting: Family Medicine

## 2015-04-14 ENCOUNTER — Ambulatory Visit (INDEPENDENT_AMBULATORY_CARE_PROVIDER_SITE_OTHER): Payer: BLUE CROSS/BLUE SHIELD | Admitting: Family Medicine

## 2015-04-14 VITALS — BP 112/78 | HR 94 | Temp 98.1°F | Resp 16 | Ht 66.0 in | Wt 176.5 lb

## 2015-04-14 DIAGNOSIS — R200 Anesthesia of skin: Secondary | ICD-10-CM

## 2015-04-14 DIAGNOSIS — F411 Generalized anxiety disorder: Secondary | ICD-10-CM | POA: Diagnosis not present

## 2015-04-14 DIAGNOSIS — M2550 Pain in unspecified joint: Secondary | ICD-10-CM | POA: Diagnosis not present

## 2015-04-14 DIAGNOSIS — R208 Other disturbances of skin sensation: Secondary | ICD-10-CM

## 2015-04-14 LAB — CBC WITH DIFFERENTIAL/PLATELET
Basophils Absolute: 0 10*3/uL (ref 0.0–0.1)
Basophils Relative: 0.6 % (ref 0.0–3.0)
Eosinophils Absolute: 0.1 10*3/uL (ref 0.0–0.7)
Eosinophils Relative: 1.8 % (ref 0.0–5.0)
HCT: 38.8 % (ref 36.0–46.0)
Hemoglobin: 13 g/dL (ref 12.0–15.0)
Lymphocytes Relative: 31.1 % (ref 12.0–46.0)
Lymphs Abs: 1.7 10*3/uL (ref 0.7–4.0)
MCHC: 33.6 g/dL (ref 30.0–36.0)
MCV: 90.9 fl (ref 78.0–100.0)
Monocytes Absolute: 0.3 10*3/uL (ref 0.1–1.0)
Monocytes Relative: 5.7 % (ref 3.0–12.0)
Neutro Abs: 3.2 10*3/uL (ref 1.4–7.7)
Neutrophils Relative %: 60.8 % (ref 43.0–77.0)
Platelets: 271 10*3/uL (ref 150.0–400.0)
RBC: 4.27 Mil/uL (ref 3.87–5.11)
RDW: 14.4 % (ref 11.5–15.5)
WBC: 5.3 10*3/uL (ref 4.0–10.5)

## 2015-04-14 LAB — BASIC METABOLIC PANEL
BUN: 10 mg/dL (ref 6–23)
CO2: 28 mEq/L (ref 19–32)
Calcium: 9.1 mg/dL (ref 8.4–10.5)
Chloride: 104 mEq/L (ref 96–112)
Creatinine, Ser: 0.66 mg/dL (ref 0.40–1.20)
GFR: 104.91 mL/min (ref >60.00)
Glucose, Bld: 82 mg/dL (ref 70–99)
Potassium: 4 mEq/L (ref 3.5–5.1)
Sodium: 138 mEq/L (ref 135–145)

## 2015-04-14 LAB — B12 AND FOLATE PANEL
Folate: 12.4 ng/mL (ref >5.9)
Vitamin B-12: 489 pg/mL (ref 211–911)

## 2015-04-14 LAB — SEDIMENTATION RATE: Sed Rate: 8 mm/hr (ref 0–22)

## 2015-04-14 LAB — TSH: TSH: 23.17 u[IU]/mL — ABNORMAL HIGH (ref 0.35–4.50)

## 2015-04-14 LAB — VITAMIN D 25 HYDROXY (VIT D DEFICIENCY, FRACTURES): VITD: 25.67 ng/mL — ABNORMAL LOW (ref 30.00–100.00)

## 2015-04-14 MED ORDER — DULOXETINE HCL 30 MG PO CPEP: 30.0000 mg | ORAL_CAPSULE | Freq: Every day | ORAL | Status: DC

## 2015-04-14 NOTE — Assessment & Plan Note (Signed)
Ongoing issue for pt.  Mild improvement w/ starting Cymbalta.  Will increase to 30mg  and monitor for continued symptom improvement.  Pt expressed understanding and is in agreement w/ plan.

## 2015-04-14 NOTE — Patient Instructions (Signed)
Follow up in 6 weeks to recheck mood, pain and numbness We'll notify you of your lab results and make any changes if needed We'll call you with your neurology appt for the numbness Increase the Cymbalta to 30mg  daily- new prescription sent Call with any questions or concerns Happy Early Birthday!!!

## 2015-04-14 NOTE — Assessment & Plan Note (Signed)
Ongoing.  Pt's deep muscle aches has resolved but joint pain persists.  Check labs to r/o rheumatologic process.  Will continue to follow.

## 2015-04-14 NOTE — Progress Notes (Signed)
   Subjective:    Patient ID: Nicole Perry, female    DOB: 24-May-1976, 39 y.o.   MRN: [id number]  HPI Anxiety- pt reports she took the Cymbalta regularly for the 1st month and then this month is more like every other day.    Polyarthralgia- pt reports ongoing migratory joint pains: elbows, thumbs.  Previously in knees.  Pt reports deep muscle pain has improved since starting Cymbalta.  Family hx of MS- mom and maternal uncle w/ MS.  MGM was suspected to also have.  Pt concerned for possible illness given family hx and that her sxs seem to cluster on L side.  Pt denies weakness but has had numbness of L foot.   Review of Systems For ROS see HPI     Objective:   Physical Exam  Constitutional: She is oriented to person, place, and time. She appears well-developed and well-nourished. No distress.  HENT:  Head: Normocephalic and atraumatic.  Eyes: Conjunctivae and EOM are normal. Pupils are equal, round, and reactive to light.  Neck: Normal range of motion. Neck supple. No thyromegaly present.  Cardiovascular: Normal rate, regular rhythm, normal heart sounds and intact distal pulses.   No murmur heard. Pulmonary/Chest: Effort normal and breath sounds normal. No respiratory distress.  Abdominal: Soft. She exhibits no distension. There is no tenderness.  Musculoskeletal: She exhibits no edema.  Lymphadenopathy:    She has no cervical adenopathy.  Neurological: She is alert and oriented to person, place, and time.  Decreased sensation to monofilament testing on feet bilaterally w/ deficits increasing as we moved laterally  Skin: Skin is warm and dry.  Psychiatric: She has a normal mood and affect. Her behavior is normal.  Vitals reviewed.         Assessment & Plan:

## 2015-04-14 NOTE — Progress Notes (Signed)
Pre visit review using our clinic review tool, if applicable. No additional management support is needed unless otherwise documented below in the visit note. 

## 2015-04-14 NOTE — Assessment & Plan Note (Signed)
New.  Given family hx of MS and pt's relatively new numbness of feet bilaterally will get labs to r/o B12/folate deficiency, thyroid abnormality, anemia or other metabolic disturbance.  Refer to neuro for complete evaluation and tx prn.  Pt expressed understanding and is in agreement w/ plan.

## 2015-04-15 ENCOUNTER — Encounter: Payer: Self-pay | Admitting: Family Medicine

## 2015-04-15 ENCOUNTER — Other Ambulatory Visit: Payer: Self-pay | Admitting: General Practice

## 2015-04-15 DIAGNOSIS — E038 Other specified hypothyroidism: Secondary | ICD-10-CM

## 2015-04-15 LAB — ANA: Anti Nuclear Antibody(ANA): NEGATIVE

## 2015-04-15 MED ORDER — VITAMIN D (ERGOCALCIFEROL) 1.25 MG (50000 UNIT) PO CAPS: 50000.0000 [IU] | ORAL_CAPSULE | ORAL | Status: DC

## 2015-04-15 MED ORDER — LEVOTHYROXINE SODIUM 100 MCG PO TABS: 100.0000 ug | ORAL_TABLET | Freq: Every day | ORAL | Status: DC

## 2015-04-16 ENCOUNTER — Encounter: Payer: Self-pay | Admitting: Family Medicine

## 2015-04-16 LAB — B. BURGDORFI ANTIBODIES: B burgdorferi Ab IgG+IgM: 0.26 {ISR}

## 2015-05-26 ENCOUNTER — Ambulatory Visit: Payer: BLUE CROSS/BLUE SHIELD | Admitting: Family Medicine

## 2015-05-28 ENCOUNTER — Telehealth: Payer: Self-pay | Admitting: Family Medicine

## 2015-05-28 NOTE — Telephone Encounter (Signed)
Pt was no show 05/26/15 8:30am, 6 week f/u appt, pt called 05/28/15 and was rescheduled for 06/23/15, charge for no show?

## 2015-05-29 NOTE — Telephone Encounter (Signed)
Yes- pt needs a no-show fee 

## 2015-06-23 ENCOUNTER — Ambulatory Visit (INDEPENDENT_AMBULATORY_CARE_PROVIDER_SITE_OTHER): Payer: BLUE CROSS/BLUE SHIELD | Admitting: Family Medicine

## 2015-06-23 ENCOUNTER — Encounter: Payer: Self-pay | Admitting: Family Medicine

## 2015-06-23 VITALS — BP 116/80 | HR 76 | Temp 98.0°F | Resp 16 | Ht 66.0 in | Wt 166.5 lb

## 2015-06-23 DIAGNOSIS — E038 Other specified hypothyroidism: Secondary | ICD-10-CM

## 2015-06-23 DIAGNOSIS — F411 Generalized anxiety disorder: Secondary | ICD-10-CM

## 2015-06-23 DIAGNOSIS — E039 Hypothyroidism, unspecified: Secondary | ICD-10-CM | POA: Insufficient documentation

## 2015-06-23 LAB — TSH: TSH: 0.79 u[IU]/mL (ref 0.35–4.50)

## 2015-06-23 NOTE — Progress Notes (Signed)
Pre visit review using our clinic review tool, if applicable. No additional management support is needed unless otherwise documented below in the visit note. 

## 2015-06-23 NOTE — Patient Instructions (Signed)
Schedule your complete physical in 6 months We'll notify you of your thyroid level and make medication adjustments as needed Keep up the good work on healthy diet and regular exercise- you look great! Try and pair your Cymbalta with brushing your teeth so it becomes routine Call with any questions or concerns Happy Fall!!!

## 2015-06-23 NOTE — Assessment & Plan Note (Signed)
New dx at last visit.  Pt reports all sxs have resolved since starting Levothyroxine.  Pt did not return for repeat TSH as directed.  Will repeat today and adjust meds prn.

## 2015-06-23 NOTE — Progress Notes (Signed)
   Subjective:    Patient ID: Nicole Perry, female    DOB: 1976/09/02, 39 y.o.   MRN: [id number]  HPI Hypothyroid- new dx at last visit.  Has not missed a dose of medication.  Fatigue has completely resolved.  Joint pains have also improved.  Very few leg cramps since starting medication.  Pt has lost 11 lbs since starting meds.  Anxiety- has been missing Cymbalta doses b/c she must take 30 minutes after thyroid medication.  She reports if she misses meds for 2 or more days, she has withdrawal sxs- dizziness, nausea.   Review of Systems For ROS see HPI     Objective:   Physical Exam  Constitutional: She is oriented to person, place, and time. She appears well-developed and well-nourished. No distress.  HENT:  Head: Normocephalic and atraumatic.  Eyes: Conjunctivae and EOM are normal. Pupils are equal, round, and reactive to light.  Neck: Normal range of motion. Neck supple. No thyromegaly present.  Cardiovascular: Normal rate, regular rhythm, normal heart sounds and intact distal pulses.   No murmur heard. Pulmonary/Chest: Effort normal and breath sounds normal. No respiratory distress.  Musculoskeletal: She exhibits no edema.  Lymphadenopathy:    She has no cervical adenopathy.  Neurological: She is alert and oriented to person, place, and time.  Skin: Skin is warm and dry.  Psychiatric: She has a normal mood and affect. Her behavior is normal.  Vitals reviewed.         Assessment & Plan:

## 2015-06-23 NOTE — Assessment & Plan Note (Signed)
Improved since starting thyroid medication and increasing Cymbalta.  Encouraged her to place her pill bottle next to her toothbrush so she can start to take her meds more regularly by associating it with a daily activity.  Pt expressed understanding and is in agreement w/ plan.

## 2015-08-28 ENCOUNTER — Other Ambulatory Visit: Payer: Self-pay | Admitting: Obstetrics & Gynecology

## 2015-08-28 DIAGNOSIS — N6310 Unspecified lump in the right breast, unspecified quadrant: Secondary | ICD-10-CM

## 2015-09-04 ENCOUNTER — Ambulatory Visit (INDEPENDENT_AMBULATORY_CARE_PROVIDER_SITE_OTHER): Payer: BLUE CROSS/BLUE SHIELD | Admitting: Family Medicine

## 2015-09-04 ENCOUNTER — Encounter: Payer: Self-pay | Admitting: Family Medicine

## 2015-09-04 VITALS — BP 120/80 | HR 88 | Temp 97.9°F | Resp 16 | Ht 66.0 in | Wt 172.2 lb

## 2015-09-04 DIAGNOSIS — J0100 Acute maxillary sinusitis, unspecified: Secondary | ICD-10-CM | POA: Diagnosis not present

## 2015-09-04 DIAGNOSIS — R5383 Other fatigue: Secondary | ICD-10-CM | POA: Diagnosis not present

## 2015-09-04 LAB — CBC WITH DIFFERENTIAL/PLATELET
Basophils Absolute: 0 10*3/uL (ref 0.0–0.1)
Basophils Relative: 0 % (ref 0–1)
Eosinophils Absolute: 0.1 10*3/uL (ref 0.0–0.7)
Eosinophils Relative: 1 % (ref 0–5)
HCT: 38.6 % (ref 36.0–46.0)
Hemoglobin: 13 g/dL (ref 12.0–15.0)
Lymphocytes Relative: 28 % (ref 12–46)
Lymphs Abs: 1.7 10*3/uL (ref 0.7–4.0)
MCH: 30.4 pg (ref 26.0–34.0)
MCHC: 33.7 g/dL (ref 30.0–36.0)
MCV: 90.4 fL (ref 78.0–100.0)
MPV: 9.2 fL (ref 8.6–12.4)
Monocytes Absolute: 0.5 10*3/uL (ref 0.1–1.0)
Monocytes Relative: 8 % (ref 3–12)
Neutro Abs: 3.8 10*3/uL (ref 1.7–7.7)
Neutrophils Relative %: 63 % (ref 43–77)
Platelets: 266 10*3/uL (ref 150–400)
RBC: 4.27 MIL/uL (ref 3.87–5.11)
RDW: 13 % (ref 11.5–15.5)
WBC: 6.1 10*3/uL (ref 4.0–10.5)

## 2015-09-04 LAB — BASIC METABOLIC PANEL
BUN: 8 mg/dL (ref 7–25)
CO2: 29 mmol/L (ref 20–31)
Calcium: 9.1 mg/dL (ref 8.6–10.2)
Chloride: 105 mmol/L (ref 98–110)
Creat: 0.61 mg/dL (ref 0.50–1.10)
Glucose, Bld: 84 mg/dL (ref 65–99)
Potassium: 4.2 mmol/L (ref 3.5–5.3)
Sodium: 141 mmol/L (ref 135–146)

## 2015-09-04 LAB — TSH: TSH: 0.076 u[IU]/mL — ABNORMAL LOW (ref 0.350–4.500)

## 2015-09-04 MED ORDER — SULFAMETHOXAZOLE-TRIMETHOPRIM 800-160 MG PO TABS: 1.0000 | ORAL_TABLET | Freq: Two times a day (BID) | ORAL | Status: DC

## 2015-09-04 NOTE — Patient Instructions (Signed)
Follow up as needed We'll notify you of your lab results and make any changes if needed Start the bactrim twice daily for the sinus infection Drink plenty of fluids REST! Call with any questions or concerns If you want to join [place] at the new [place] office, any scheduled appointments will automatically transfer and we will see you at [address] [place] [address] [place], [place] [address] ([place] 09/29/15) Cole in there! Happy Holidays!

## 2015-09-04 NOTE — Progress Notes (Signed)
Pre visit review using our clinic review tool, if applicable. No additional management support is needed unless otherwise documented below in the visit note. 

## 2015-09-04 NOTE — Progress Notes (Signed)
   Subjective:    Patient ID: Nicole Perry, female    DOB: 04/09/1976, 39 y.o.   MRN: [id number]  HPI URI- pt reports sxs started a few weeks ago as a cold.  This week, developed L sided facial pain.  + nasal congestion.  No tooth pain.  + neck pain.  L ear pain earlier this week.  No fevers.  Cough started yesterday.  + sick contacts.  Fatigue- pt reports excessive fatigue this week.  'it's almost overwhelming'.  Hx of thyroid issues.     Review of Systems For ROS see HPI     Objective:   Physical Exam  Constitutional: She appears well-developed and well-nourished. No distress.  HENT:  Head: Normocephalic and atraumatic.  Right Ear: Tympanic membrane normal.  Left Ear: Tympanic membrane normal.  Nose: Mucosal edema and rhinorrhea present. Right sinus exhibits no maxillary sinus tenderness and no frontal sinus tenderness. Left sinus exhibits maxillary sinus tenderness and frontal sinus tenderness.  Mouth/Throat: Uvula is midline and mucous membranes are normal. Posterior oropharyngeal erythema present. No oropharyngeal exudate.  Eyes: Conjunctivae and EOM are normal. Pupils are equal, round, and reactive to light.  Neck: Normal range of motion. Neck supple.  Cardiovascular: Normal rate, regular rhythm and normal heart sounds.   Pulmonary/Chest: Effort normal and breath sounds normal. No respiratory distress. She has no wheezes.  Lymphadenopathy:    She has no cervical adenopathy.  Vitals reviewed.         Assessment & Plan:

## 2015-09-06 NOTE — Assessment & Plan Note (Signed)
New.  Pt's sxs and PE consistent w/ infxn.  Start abx.  Reviewed supportive care and red flags that should prompt return.  Pt expressed understanding and is in agreement w/ plan.  

## 2015-09-06 NOTE — Assessment & Plan Note (Signed)
Recurrent problem for pt.  Hx of thyroid abnormalities.  Check labs and adjust thyroid meds prn.  Also r/o anemia or electrolyte abnormalities.  May be due to pt's current illness but will r/o other causes.  Pt expressed understanding and is in agreement w/ plan.

## 2015-09-07 ENCOUNTER — Other Ambulatory Visit: Payer: Self-pay | Admitting: General Practice

## 2015-09-07 MED ORDER — LEVOTHYROXINE SODIUM 88 MCG PO TABS: 88.0000 ug | ORAL_TABLET | Freq: Every day | ORAL | Status: DC

## 2015-09-29 ENCOUNTER — Ambulatory Visit
Admission: RE | Admit: 2015-09-29 | Discharge: 2015-09-29 | Disposition: A | Discharge status: Home / Self Care | Payer: BLUE CROSS/BLUE SHIELD | Source: Ambulatory Visit | Attending: Obstetrics & Gynecology | Admitting: Obstetrics & Gynecology

## 2015-09-29 ENCOUNTER — Other Ambulatory Visit: Payer: Self-pay | Admitting: Obstetrics & Gynecology

## 2015-09-29 DIAGNOSIS — N6310 Unspecified lump in the right breast, unspecified quadrant: Secondary | ICD-10-CM

## 2015-10-22 IMAGING — CT CT ANGIO CHEST
2 of 6 series · 19 of 36 positions shown · IV contrast (APPLIED)
Comparison: None.

CLINICAL DATA: Shortness of breath, difficulty breathing for 1
week. Palpitations with pain on deep inspiration.

EXAM:
CT ANGIOGRAPHY CHEST WITH CONTRAST
TECHNIQUE: Multidetector CT imaging of the chest was performed using the
standard protocol during bolus administration of intravenous
contrast. Multiplanar CT image reconstructions and MIPs were
obtained to evaluate the vascular anatomy.
CONTRAST:  100mL OMNIPAQUE IOHEXOL 350 MG/ML SOLN

[Series 5: pe 1.0 b26f · axial · 0.60mm/px · z∈[-325,-39]mm · 18 of 318 slices shown]
[im 16/318  lung]
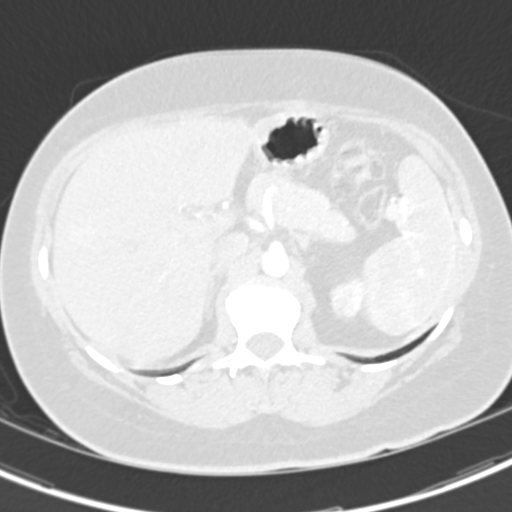
[im 32/318  mediastinal]
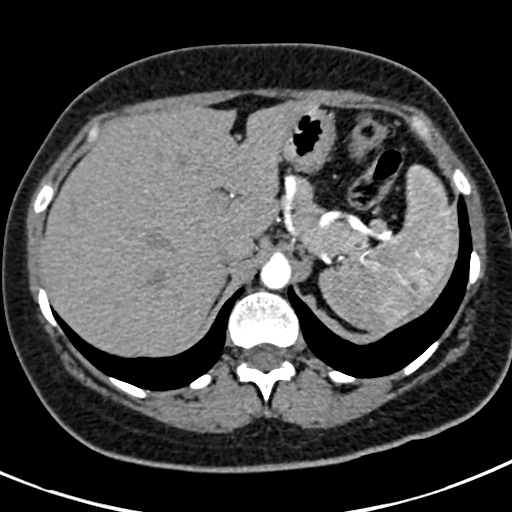
[im 48/318  lung]
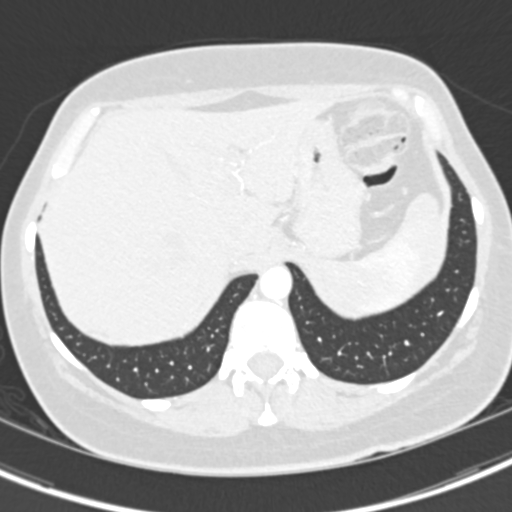
[im 64/318  mediastinal]
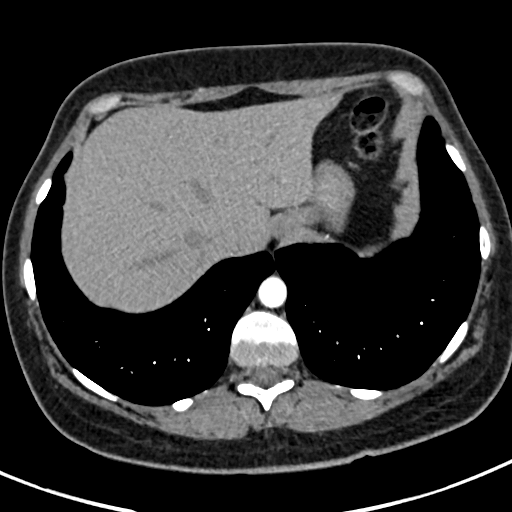
[im 80/318  lung]
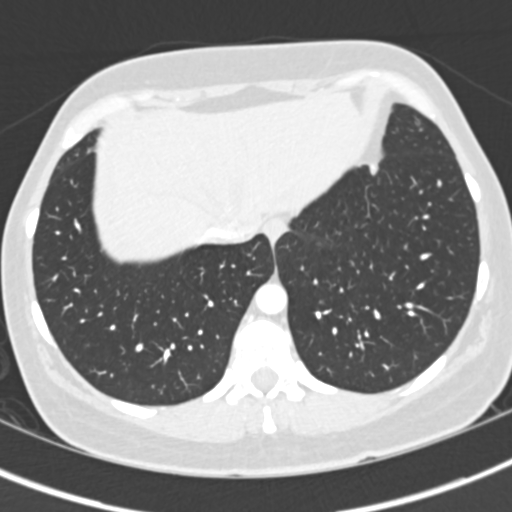
[im 96/318  mediastinal]
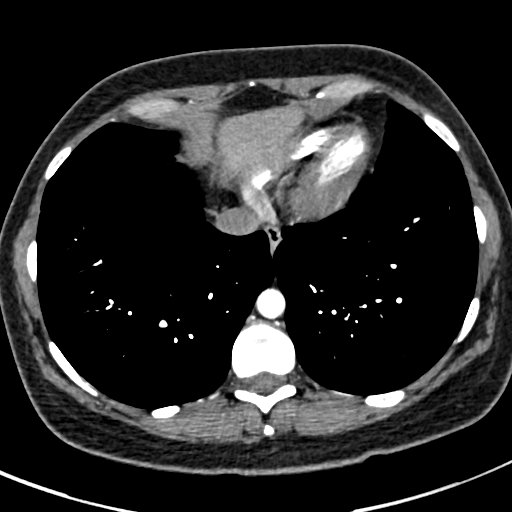
[im 111/318  lung]
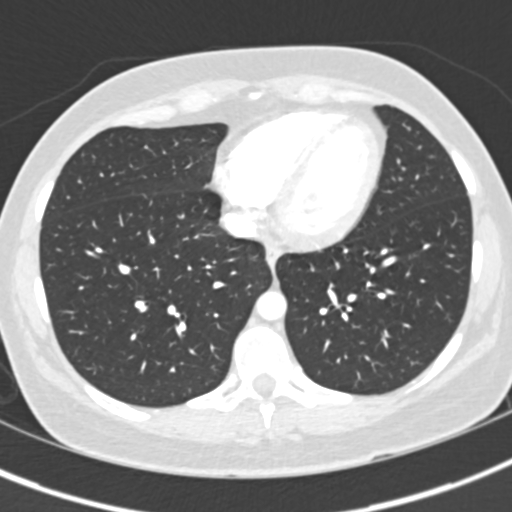
[im 127/318  mediastinal]
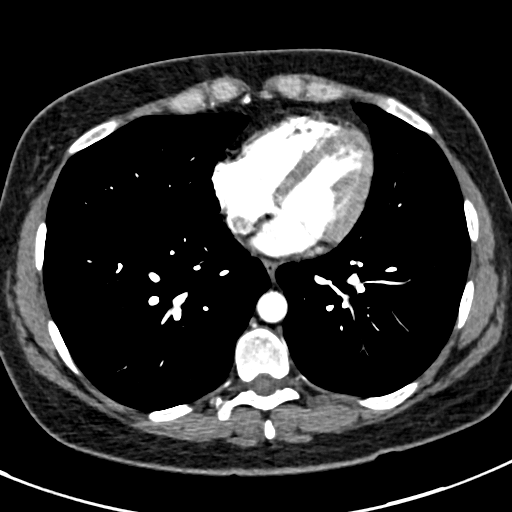
[im 143/318  lung]
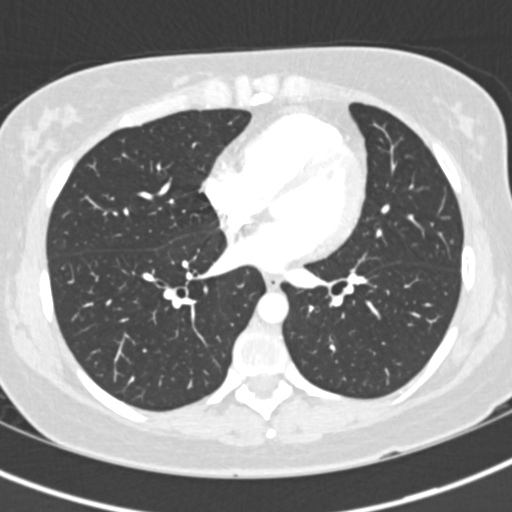
[im 175/318  mediastinal]
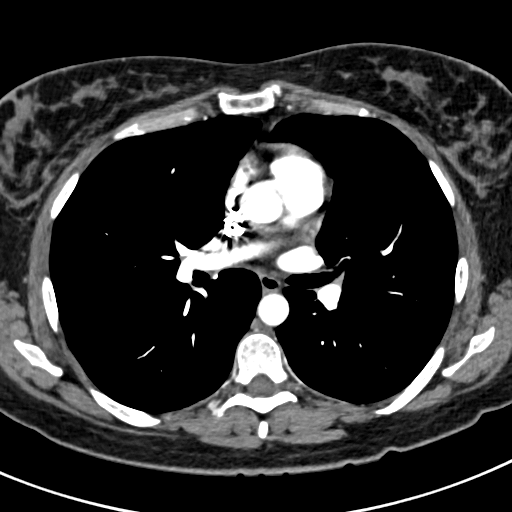
[im 191/318  lung]
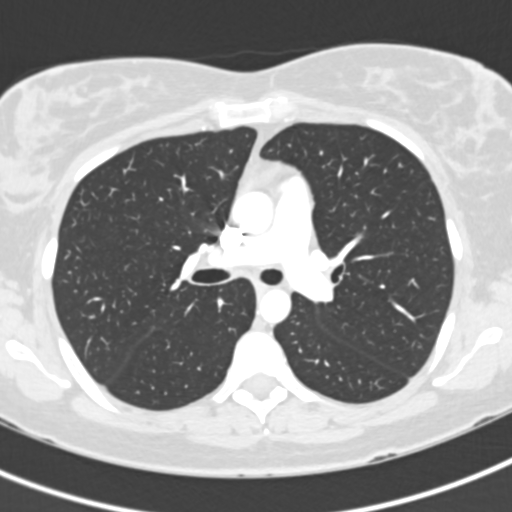
[im 207/318  mediastinal]
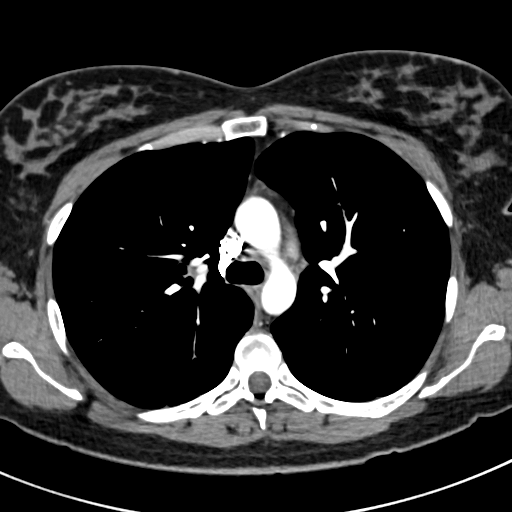
[im 222/318  lung]
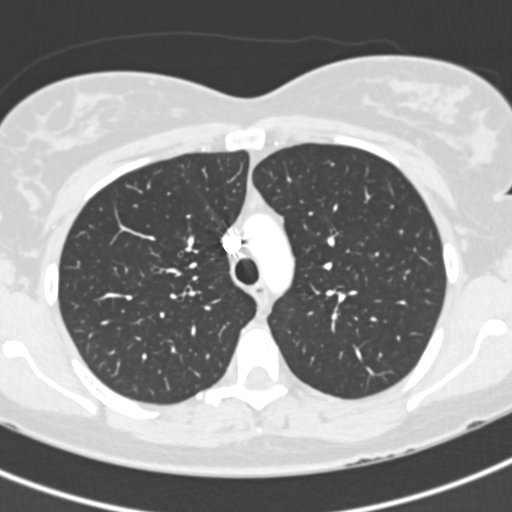
[im 238/318  mediastinal]
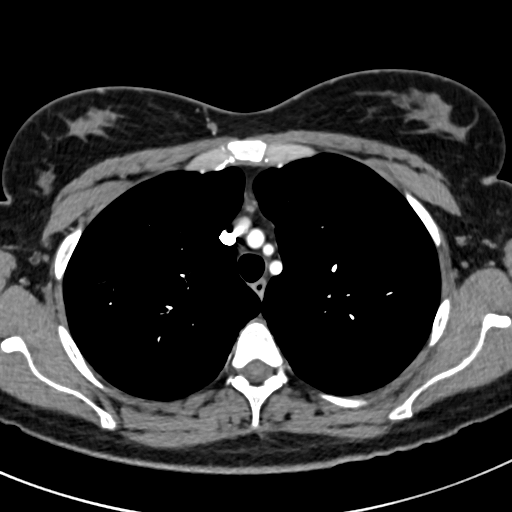
[im 254/318  lung]
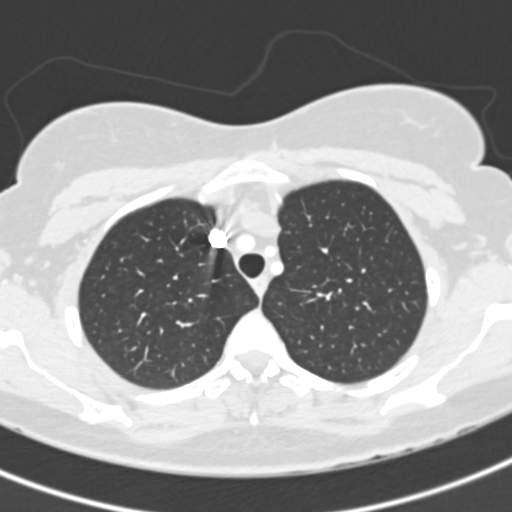
[im 270/318  mediastinal]
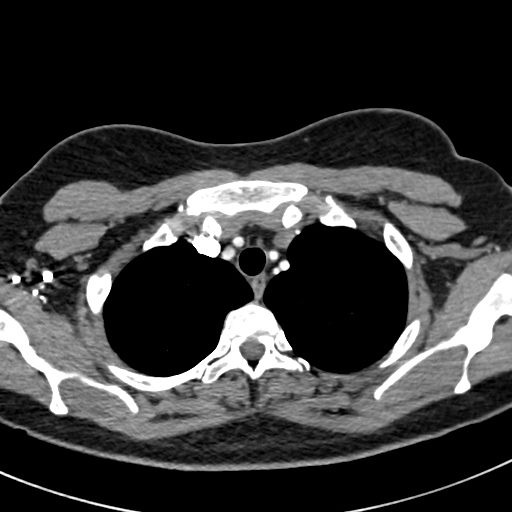
[im 286/318  lung]
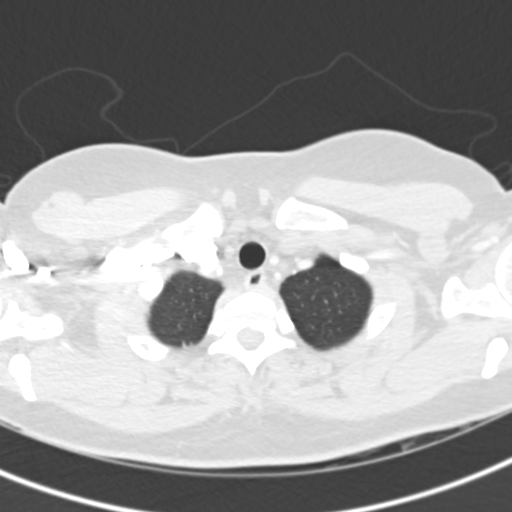
[im 302/318  mediastinal]
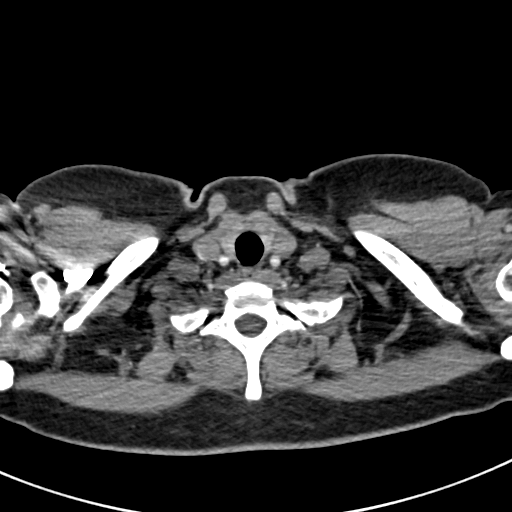

[Series 8: pe 2.0 coronal · coronal · 0.59mm/px · 1 of 117 slices shown]
[im 59/117  mediastinal]
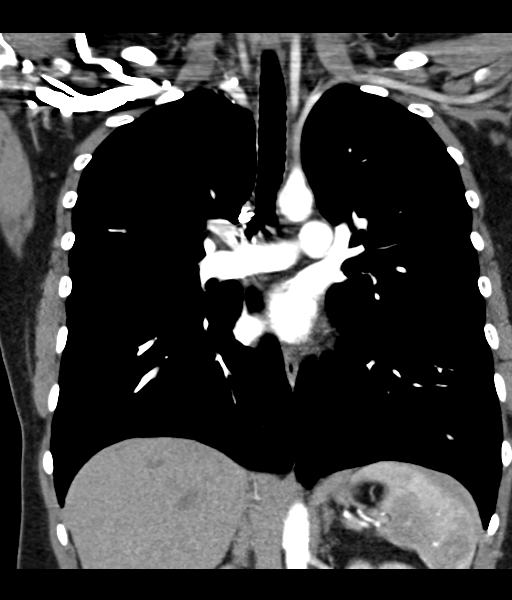

[19 of 36 positions shown; findings below may reference images not displayed]

FINDINGS: Mediastinum/Nodes: No pulmonary embolus. Probable residual thymic
tissue in the prevascular space. No pathologically enlarged
mediastinal, hilar or axillary lymph nodes. Heart size normal. No
pericardial effusion.

Lungs/Pleura: Lungs are clear. No pleural fluid. Airway is
unremarkable.

Upper abdomen: Visualized portions of the liver, gallbladder,
adrenal glands and left kidney are grossly unremarkable.
Heterogeneity within the spleen is presumably due to phase of
contrast enhancement although there are some more rounded areas
within the anterior aspect of the spleen. Visualized portions of the
pancreas, stomach and bowel are grossly unremarkable. No upper
abdominal adenopathy.

Musculoskeletal:  No worrisome lytic or sclerotic lesions.

Review of the MIP images confirms the above findings.
IMPRESSION: 1. Negative for pulmonary embolus.
2. No findings to explain the patient's symptoms.

## 2015-10-30 ENCOUNTER — Encounter: Payer: Self-pay | Admitting: Family Medicine

## 2015-10-30 ENCOUNTER — Ambulatory Visit (INDEPENDENT_AMBULATORY_CARE_PROVIDER_SITE_OTHER): Payer: BLUE CROSS/BLUE SHIELD | Admitting: Family Medicine

## 2015-10-30 VITALS — BP 120/80 | HR 98 | Temp 98.2°F | Ht 66.0 in | Wt 177.6 lb

## 2015-10-30 DIAGNOSIS — F411 Generalized anxiety disorder: Secondary | ICD-10-CM | POA: Diagnosis not present

## 2015-10-30 MED ORDER — DULOXETINE HCL 60 MG PO CPEP: 60.0000 mg | ORAL_CAPSULE | Freq: Every day | ORAL | Status: DC

## 2015-10-30 NOTE — Progress Notes (Signed)
Pre visit review using our clinic review tool, if applicable. No additional management support is needed unless otherwise documented below in the visit note. 

## 2015-10-30 NOTE — Progress Notes (Signed)
   Subjective:    Patient ID: Nicole Perry, female    DOB: Nov 22, 1975, 40 y.o.   MRN: [id number]  HPI Anxiety- has been having difficult time in marriage for last 6-7 months and last week found out husband of 90 yrs cheated.  Husband now wants to work on marriage- pt isn't sure.  Pt has sensation that 'I am building to a panic attack'.  Pt is feeling very overwhelmed.   Review of Systems For ROS see HPI     Objective:   Physical Exam  Constitutional: She is oriented to person, place, and time. She appears well-developed and well-nourished. No distress.  HENT:  Head: Normocephalic and atraumatic.  Neurological: She is alert and oriented to person, place, and time.  Skin: Skin is warm and dry.  Psychiatric: She has a normal mood and affect. Her behavior is normal. Thought content normal.  Appropriately tearful when talking about her marriage  Vitals reviewed.         Assessment & Plan:

## 2015-10-30 NOTE — Patient Instructions (Signed)
Follow up in 3-4 weeks to recheck mood Increase the Cymbalta to 60mg  daily- 2 of what you have at home and 1 of the new prescription Try and find healthy stress outlets- art, music, reading, journal writing- you deserve it! Ask yourself, 'am I really hungry?' before snacking Call with any questions or concerns Hang in there!!!

## 2015-11-01 NOTE — Assessment & Plan Note (Signed)
Deteriorated.  Pt is struggling w/ the revelation that her husband has been unfaithful.  Very emotionally fragile.  Increase Cymbalta to 60mg  and monitor for improvement.  Pt is already in counseling- both couples and individual.  Will follow closely.

## 2015-11-20 ENCOUNTER — Telehealth: Payer: Self-pay | Admitting: Family Medicine

## 2015-11-20 ENCOUNTER — Ambulatory Visit: Payer: BLUE CROSS/BLUE SHIELD | Admitting: Family Medicine

## 2015-11-23 ENCOUNTER — Encounter: Payer: Self-pay | Admitting: Family Medicine

## 2015-11-23 NOTE — Telephone Encounter (Signed)
Marked to charge, mailing no show letter °

## 2015-11-23 NOTE — Telephone Encounter (Signed)
Pt was no show 11/20/15 9:30am for cpe appt, pt has rescheduled for 12/21/15, this is the 4th no show w/in 12 months, charge or no charge?

## 2015-11-23 NOTE — Telephone Encounter (Signed)
Absolutely charge.  And if she doesn't show for next appt, will be dismissed

## 2015-11-25 ENCOUNTER — Telehealth: Payer: Self-pay | Admitting: Family Medicine

## 2015-11-25 NOTE — Telephone Encounter (Signed)
Chart updated to reflect 

## 2015-11-25 NOTE — Telephone Encounter (Signed)
Pt states she received flu shot here in our office early in the flu season. Pt had OV 06/23/15 but I did not see documentation of flu shot.

## 2015-12-16 ENCOUNTER — Encounter: Payer: Self-pay | Admitting: Physician Assistant

## 2015-12-16 ENCOUNTER — Ambulatory Visit (INDEPENDENT_AMBULATORY_CARE_PROVIDER_SITE_OTHER): Payer: BLUE CROSS/BLUE SHIELD | Admitting: Physician Assistant

## 2015-12-16 ENCOUNTER — Ambulatory Visit: Payer: BLUE CROSS/BLUE SHIELD | Admitting: Family Medicine

## 2015-12-16 VITALS — BP 110/80 | HR 89 | Temp 97.9°F | Ht 66.0 in | Wt 180.6 lb

## 2015-12-16 DIAGNOSIS — B9689 Other specified bacterial agents as the cause of diseases classified elsewhere: Secondary | ICD-10-CM

## 2015-12-16 DIAGNOSIS — J019 Acute sinusitis, unspecified: Secondary | ICD-10-CM | POA: Diagnosis not present

## 2015-12-16 MED ORDER — FLUTICASONE PROPIONATE 50 MCG/ACT NA SUSP: 2.0000 | Freq: Every day | NASAL | Status: DC

## 2015-12-16 MED ORDER — DOXYCYCLINE HYCLATE 100 MG PO CAPS: 100.0000 mg | ORAL_CAPSULE | Freq: Two times a day (BID) | ORAL | Status: DC

## 2015-12-16 NOTE — Progress Notes (Signed)
Pre visit review using our clinic review tool, if applicable. No additional management support is needed unless otherwise documented below in the visit note. 

## 2015-12-16 NOTE — Patient Instructions (Signed)
Please take antibiotic as directed.  Increase fluid intake.  Use Saline nasal spray.  Take a daily multivitamin. Use Flonase as directed. Delsym for cough.  Place a humidifier in the bedroom.  Please call or return clinic if symptoms are not improving.  Sinusitis Sinusitis is redness, soreness, and swelling (inflammation) of the paranasal sinuses. Paranasal sinuses are air pockets within the bones of your face (beneath the eyes, the middle of the forehead, or above the eyes). In healthy paranasal sinuses, mucus is able to drain out, and air is able to circulate through them by way of your nose. However, when your paranasal sinuses are inflamed, mucus and air can become trapped. This can allow bacteria and other germs to grow and cause infection. Sinusitis can develop quickly and last only a short time (acute) or continue over a long period (chronic). Sinusitis that lasts for more than 12 weeks is considered chronic.  CAUSES  Causes of sinusitis include:  Allergies.  Structural abnormalities, such as displacement of the cartilage that separates your nostrils (deviated septum), which can decrease the air flow through your nose and sinuses and affect sinus drainage.  Functional abnormalities, such as when the small hairs (cilia) that line your sinuses and help remove mucus do not work properly or are not present. SYMPTOMS  Symptoms of acute and chronic sinusitis are the same. The primary symptoms are pain and pressure around the affected sinuses. Other symptoms include:  Upper toothache.  Earache.  Headache.  Bad breath.  Decreased sense of smell and taste.  A cough, which worsens when you are lying flat.  Fatigue.  Fever.  Thick drainage from your nose, which often is green and may contain pus (purulent).  Swelling and warmth over the affected sinuses. DIAGNOSIS  Your caregiver will perform a physical exam. During the exam, your caregiver may:  Look in your nose for signs of  abnormal growths in your nostrils (nasal polyps).  Tap over the affected sinus to check for signs of infection.  View the inside of your sinuses (endoscopy) with a special imaging device with a light attached (endoscope), which is inserted into your sinuses. If your caregiver suspects that you have chronic sinusitis, one or more of the following tests may be recommended:  Allergy tests.  Nasal culture A sample of mucus is taken from your nose and sent to a lab and screened for bacteria.  Nasal cytology A sample of mucus is taken from your nose and examined by your caregiver to determine if your sinusitis is related to an allergy. TREATMENT  Most cases of acute sinusitis are related to a viral infection and will resolve on their own within 10 days. Sometimes medicines are prescribed to help relieve symptoms (pain medicine, decongestants, nasal steroid sprays, or saline sprays).  However, for sinusitis related to a bacterial infection, your caregiver will prescribe antibiotic medicines. These are medicines that will help kill the bacteria causing the infection.  Rarely, sinusitis is caused by a fungal infection. In theses cases, your caregiver will prescribe antifungal medicine. For some cases of chronic sinusitis, surgery is needed. Generally, these are cases in which sinusitis recurs more than 3 times per year, despite other treatments. HOME CARE INSTRUCTIONS   Drink plenty of water. Water helps thin the mucus so your sinuses can drain more easily.  Use a humidifier.  Inhale steam 3 to 4 times a day (for example, sit in the bathroom with the shower running).  Apply a warm, moist washcloth to your  face 3 to 4 times a day, or as directed by your caregiver.  Use saline nasal sprays to help moisten and clean your sinuses.  Take over-the-counter or prescription medicines for pain, discomfort, or fever only as directed by your caregiver. SEEK IMMEDIATE MEDICAL CARE IF:  You have increasing  pain or severe headaches.  You have nausea, vomiting, or drowsiness.  You have swelling around your face.  You have vision problems.  You have a stiff neck.  You have difficulty breathing. MAKE SURE YOU:   Understand these instructions.  Will watch your condition.  Will get help right away if you are not doing well or get worse. Document Released: 09/12/2005 Document Revised: 12/05/2011 Document Reviewed: 09/27/2011 [facility] Patient Information 2014 [place], [place].

## 2015-12-16 NOTE — Progress Notes (Signed)
Patient presents to clinic today c/o 2 weeks of sinus pressure, sinus pain and fatigue that improved but recurrent starting last Thursday. Symptoms now include sinus pressure, sinus pain, L facial pain and ear pain.  Denies fever, chills, aches. Has taken multiple OTC regimens without improvement in symptoms.  Past Medical History  Diagnosis Date  . ADD (attention deficit disorder)   . GERD (gastroesophageal reflux disease)   . Thyroid disease     Current Outpatient Prescriptions on File Prior to Visit  Medication Sig Dispense Refill  . DULoxetine (CYMBALTA) 60 MG capsule Take 1 capsule (60 mg total) by mouth daily. 30 capsule 3  . levothyroxine (SYNTHROID, LEVOTHROID) 88 MCG tablet Take 1 tablet (88 mcg total) by mouth daily. 30 tablet 6  . omeprazole (PRILOSEC) 40 MG capsule Take 1 capsule (40 mg total) by mouth daily. 30 capsule 6  . polyethylene glycol (MIRALAX / GLYCOLAX) packet Take 17 g by mouth daily. One capful 100 each 3   No current facility-administered medications on file prior to visit.    Allergies  Allergen Reactions  . Penicillins Rash    Can tolerate Amoxicillin    Family History  Problem Relation Age of Onset  . Breast cancer Maternal Grandmother   . Diabetes Maternal Grandmother   . Other Maternal Grandmother     Blood Cancer  . High blood pressure Maternal Grandfather   . Heart disease Maternal Grandfather   . Prostate cancer Maternal Uncle   . Multiple sclerosis Mother   . Celiac disease Daughter   . Skin cancer Mother   . Irritable bowel syndrome Maternal Grandmother   . Colon polyps Maternal Grandmother   . Colon polyps Maternal Grandfather   . Colon cancer Maternal Uncle     questionable  . Heart attack Maternal Grandfather     in early 75s.     Social History   Social History  . Marital Status: Married    Spouse Name: N/A  . Number of Children: 2  . Years of Education: N/A   Occupational History  . Inventory Control    Social  History Main Topics  . Smoking status: Former Smoker -- 0.25 packs/day for 15 years    Types: Cigarettes    Quit date: 04/14/2010  . Smokeless tobacco: Never Used  . Alcohol Use: 0.0 oz/week    0 Standard drinks or equivalent per week     Comment: Socially  . Drug Use: No  . Sexual Activity: Not Asked   Other Topics Concern  . None   Social History Narrative   Review of Systems - See HPI.  All other ROS are negative.  BP 110/80 mmHg  Pulse 89  Temp(Src) 97.9 F (36.6 C) (Oral)  Ht 5\' 6"  (1.676 m)  Wt 180 lb 9.6 oz (81.92 kg)  BMI 29.16 kg/m2  SpO2 99%  LMP 03/11/2014  Physical Exam  Constitutional: She is oriented to person, place, and time and well-developed, well-nourished, and in no distress.  HENT:  Head: Normocephalic and atraumatic.  Eyes: Conjunctivae are normal.  Cardiovascular: Normal rate, regular rhythm, normal heart sounds and intact distal pulses.   Pulmonary/Chest: Effort normal and breath sounds normal. No respiratory distress. She has no wheezes. She has no rales. She exhibits no tenderness.  Neurological: She is alert and oriented to person, place, and time.  Skin: Skin is warm and dry. No rash noted.  Psychiatric: Affect normal.  Vitals reviewed.  Assessment/Plan: Acute bacterial sinusitis Rx Doxycycline.  Increase fluids.  Rest.  Saline nasal spray.  Probiotic.  Mucinex as directed.  Humidifier in bedroom. Flonase per orders.  Call or return to clinic if symptoms are not improving.

## 2015-12-16 NOTE — Assessment & Plan Note (Signed)
Rx Doxycycline.  Increase fluids.  Rest.  Saline nasal spray.  Probiotic.  Mucinex as directed.  Humidifier in bedroom. Flonase per orders.  Call or return to clinic if symptoms are not improving.

## 2015-12-17 ENCOUNTER — Telehealth: Payer: Self-pay | Admitting: Family Medicine

## 2015-12-17 MED ORDER — CEFDINIR 300 MG PO CAPS: 300.0000 mg | ORAL_CAPSULE | Freq: Two times a day (BID) | ORAL | Status: DC

## 2015-12-17 NOTE — Telephone Encounter (Signed)
I have sent in a prescription for cefdinir to take instead.

## 2015-12-17 NOTE — Telephone Encounter (Signed)
Called & Informed pt.

## 2015-12-17 NOTE — Telephone Encounter (Signed)
Pt says that she was seen yesterday. She is requesting a new antibiotic because the current one that she was prescribed advised to stay out of the sun. Pt says that she is going on a cruise so she will not be able to avoid the sun.  Please assist further.    Pharmacy:  [id number] - [place], [place] AT [place]: [phone]  Thanks.

## 2015-12-21 ENCOUNTER — Encounter: Payer: BLUE CROSS/BLUE SHIELD | Admitting: Family Medicine

## 2016-01-20 ENCOUNTER — Encounter: Payer: BLUE CROSS/BLUE SHIELD | Admitting: Family Medicine

## 2016-02-15 ENCOUNTER — Other Ambulatory Visit: Payer: Self-pay | Admitting: Obstetrics & Gynecology

## 2016-02-15 DIAGNOSIS — N6310 Unspecified lump in the right breast, unspecified quadrant: Secondary | ICD-10-CM

## 2016-02-22 ENCOUNTER — Encounter: Payer: BLUE CROSS/BLUE SHIELD | Admitting: Family Medicine

## 2016-03-10 ENCOUNTER — Encounter: Payer: Self-pay | Admitting: Family Medicine

## 2016-03-10 ENCOUNTER — Ambulatory Visit (INDEPENDENT_AMBULATORY_CARE_PROVIDER_SITE_OTHER): Payer: BLUE CROSS/BLUE SHIELD | Admitting: Family Medicine

## 2016-03-10 VITALS — BP 112/80 | HR 113 | Temp 98.3°F | Resp 16 | Ht 66.0 in | Wt 185.0 lb

## 2016-03-10 DIAGNOSIS — D229 Melanocytic nevi, unspecified: Secondary | ICD-10-CM

## 2016-03-10 DIAGNOSIS — F411 Generalized anxiety disorder: Secondary | ICD-10-CM

## 2016-03-10 DIAGNOSIS — Z0001 Encounter for general adult medical examination with abnormal findings: Secondary | ICD-10-CM | POA: Diagnosis not present

## 2016-03-10 DIAGNOSIS — D239 Other benign neoplasm of skin, unspecified: Secondary | ICD-10-CM | POA: Diagnosis not present

## 2016-03-10 DIAGNOSIS — Z0000 Encounter for general adult medical examination without abnormal findings: Secondary | ICD-10-CM

## 2016-03-10 MED ORDER — PANTOPRAZOLE SODIUM 40 MG PO TBEC: 40.0000 mg | DELAYED_RELEASE_TABLET | Freq: Every day | ORAL | Status: DC

## 2016-03-10 MED ORDER — TRAZODONE HCL 50 MG PO TABS: 25.0000 mg | ORAL_TABLET | Freq: Every evening | ORAL | Status: DC | PRN

## 2016-03-10 NOTE — Progress Notes (Signed)
Pre visit review using our clinic review tool, if applicable. No additional management support is needed unless otherwise documented below in the visit note. 

## 2016-03-10 NOTE — Progress Notes (Signed)
   Subjective:    Patient ID: Nicole Perry, female    DOB: 08-28-1976, 40 y.o.   MRN: [id number]  HPI CPE- UTD w/ GYN ([facility])   Review of Systems Patient reports no vision/ hearing changes, adenopathy,fever, weight change,  persistant/recurrent hoarseness , swallowing issues, chest pain, palpitations, edema, persistant/recurrent cough, hemoptysis, dyspnea (rest/exertional/paroxysmal nocturnal), gastrointestinal bleeding (melena, rectal bleeding), abdominal pain, bowel changes, GU symptoms (dysuria, hematuria, incontinence), Gyn symptoms (abnormal  bleeding, pain),  syncope, focal weakness, memory loss, numbness & tingling, skin/hair/nail changes, abnormal bruising or bleeding.   Anxiety/depression- pt moved out 7-8 weeks ago while pt is trying to decide whether she is going to separate from her husband after his affair.  Pt has 3 kids. + insomnia + GERD- breakthrough despite Omeprazole 40mg     Objective:   Physical Exam General Appearance:    Alert, cooperative, no distress, appears stated age  Head:    Normocephalic, without obvious abnormality, atraumatic  Eyes:    PERRL, conjunctiva/corneas clear, EOM's intact, fundi    benign, both eyes  Ears:    Normal TM's and external ear canals, both ears  Nose:   Nares normal, septum midline, mucosa normal, no drainage    or sinus tenderness  Throat:   Lips, mucosa, and tongue normal; teeth and gums normal  Neck:   Supple, symmetrical, trachea midline, no adenopathy;    Thyroid: no enlargement/tenderness/nodules  Back:     Symmetric, no curvature, ROM normal, no CVA tenderness  Lungs:     Clear to auscultation bilaterally, respirations unlabored  Chest Wall:    No tenderness or deformity   Heart:    Regular rate and rhythm, S1 and S2 normal, no murmur, rub   or gallop  Breast Exam:    Deferred to GYN  Abdomen:     Soft, non-tender, bowel sounds active all four quadrants,    no masses, no organomegaly  Genitalia:    Deferred to GYN    Rectal:    Extremities:   Extremities normal, atraumatic, no cyanosis or edema  Pulses:   2+ and symmetric all extremities  Skin:   Skin color, texture, turgor normal, no rashes or lesions  Lymph nodes:   Cervical, supraclavicular, and axillary nodes normal  Neurologic:   CNII-XII intact, normal strength, sensation and reflexes    throughout          Assessment & Plan:

## 2016-03-10 NOTE — Patient Instructions (Signed)
Schedule your lab visit at the [facility] office We'll notify you of your lab results and make any changes if needed Continue to work on healthy diet and regular exercise- you can do it!! Start the Trazodone at night as needed for insomnia.  Start w/ 1/2 tab and increase to full tab if needed STOP the Omeprazole START the Protonix daily for the reflux Call with any questions or concerns Hang in there!!!  You can do this!!!

## 2016-03-11 ENCOUNTER — Other Ambulatory Visit: Payer: BLUE CROSS/BLUE SHIELD

## 2016-03-11 NOTE — Assessment & Plan Note (Signed)
Ongoing issue for pt.  + insomnia.  Start low dose Trazodone in addition to Cymbalta

## 2016-03-11 NOTE — Assessment & Plan Note (Signed)
Pt's PE WNL w/ exception of obesity.  UTD on GYN.  Check labs.  Anticipatory guidance provided.  

## 2016-03-17 ENCOUNTER — Encounter: Payer: Self-pay | Admitting: Family Medicine

## 2016-03-17 ENCOUNTER — Other Ambulatory Visit: Payer: BLUE CROSS/BLUE SHIELD

## 2016-04-01 ENCOUNTER — Ambulatory Visit
Admission: RE | Admit: 2016-04-01 | Discharge: 2016-04-01 | Disposition: A | Discharge status: Home / Self Care | Payer: BLUE CROSS/BLUE SHIELD | Source: Ambulatory Visit | Attending: Obstetrics & Gynecology | Admitting: Obstetrics & Gynecology

## 2016-04-01 DIAGNOSIS — N6310 Unspecified lump in the right breast, unspecified quadrant: Secondary | ICD-10-CM

## 2016-04-14 ENCOUNTER — Encounter: Payer: Self-pay | Admitting: General Practice

## 2016-04-14 NOTE — Progress Notes (Signed)
Letter mailed to pt.  

## 2016-04-19 ENCOUNTER — Emergency Department (HOSPITAL_BASED_OUTPATIENT_CLINIC_OR_DEPARTMENT_OTHER)
Admission: EM | Admit: 2016-04-19 | Discharge: 2016-04-20 | Disposition: A | Discharge status: Home / Self Care | Payer: BLUE CROSS/BLUE SHIELD | Attending: Emergency Medicine | Admitting: Emergency Medicine

## 2016-04-19 ENCOUNTER — Emergency Department (HOSPITAL_BASED_OUTPATIENT_CLINIC_OR_DEPARTMENT_OTHER): Payer: BLUE CROSS/BLUE SHIELD

## 2016-04-19 ENCOUNTER — Encounter (HOSPITAL_BASED_OUTPATIENT_CLINIC_OR_DEPARTMENT_OTHER): Payer: Self-pay | Admitting: *Deleted

## 2016-04-19 DIAGNOSIS — Y939 Activity, unspecified: Secondary | ICD-10-CM | POA: Insufficient documentation

## 2016-04-19 DIAGNOSIS — W010XXA Fall on same level from slipping, tripping and stumbling without subsequent striking against object, initial encounter: Secondary | ICD-10-CM | POA: Insufficient documentation

## 2016-04-19 DIAGNOSIS — Y929 Unspecified place or not applicable: Secondary | ICD-10-CM | POA: Diagnosis not present

## 2016-04-19 DIAGNOSIS — W19XXXA Unspecified fall, initial encounter: Secondary | ICD-10-CM

## 2016-04-19 DIAGNOSIS — Y999 Unspecified external cause status: Secondary | ICD-10-CM | POA: Insufficient documentation

## 2016-04-19 DIAGNOSIS — Z87891 Personal history of nicotine dependence: Secondary | ICD-10-CM | POA: Insufficient documentation

## 2016-04-19 DIAGNOSIS — S76211A Strain of adductor muscle, fascia and tendon of right thigh, initial encounter: Secondary | ICD-10-CM | POA: Diagnosis not present

## 2016-04-19 DIAGNOSIS — S79921A Unspecified injury of right thigh, initial encounter: Secondary | ICD-10-CM | POA: Diagnosis present

## 2016-04-19 MED ORDER — ONDANSETRON HCL 4 MG/2ML IJ SOLN
4.0000 mg | Freq: Once | INTRAMUSCULAR | Status: AC
  Administered 2016-04-19: 4 mg via INTRAVENOUS
  Filled 2016-04-19: qty 2

## 2016-04-19 MED ORDER — CYCLOBENZAPRINE HCL 5 MG PO TABS
5.0000 mg | ORAL_TABLET | Freq: Once | ORAL | Status: AC
  Administered 2016-04-19: 5 mg via ORAL
  Filled 2016-04-19: qty 1

## 2016-04-19 MED ORDER — KETOROLAC TROMETHAMINE 30 MG/ML IJ SOLN
30.0000 mg | Freq: Once | INTRAMUSCULAR | Status: AC
  Administered 2016-04-19: 30 mg via INTRAVENOUS
  Filled 2016-04-19: qty 1

## 2016-04-19 MED ORDER — MORPHINE SULFATE (PF) 4 MG/ML IV SOLN
4.0000 mg | Freq: Once | INTRAVENOUS | Status: AC
  Administered 2016-04-19: 4 mg via INTRAVENOUS
  Filled 2016-04-19: qty 1

## 2016-04-19 NOTE — ED Notes (Signed)
Pt strained her right thigh while working out this week and then tonight her leg gave out and went underneath her and she felt it tear.  She is c/o sharp tearing pain from right groin to right knee with some tingling in right foot.  She is able to move her toes and has good distal pulses.  Provided pt with ice packs for now and a pillow to elevate her knee.

## 2016-04-19 NOTE — ED Triage Notes (Signed)
Pt c/o fall x 1 hr ago right thigh pain

## 2016-04-19 NOTE — ED Provider Notes (Signed)
[facility] DEPT [facility] Provider Note   CSN: [id number] Arrival date & time: 04/19/16  2220  First Provider Contact:  None  By signing my name below, I, Nicole Perry, attest that this documentation has been prepared under the direction and in the presence of Nicole Hacker, MD . Electronically Signed: Evelene Perry, Scribe. 04/19/2016. 11:15 PM.   History   Chief Complaint Chief Complaint  Patient presents with  . Fall    The history is provided by the patient. No language interpreter was used.   HPI Comments:  Nicole Perry is a 40 y.o. female who presents to the Emergency Department s/p fall PTA complaining of 10/10 pain to the RLE following the incident. Pt tripped on her dogs and hyperextended her knee backwards and then landed on her RLE. She denies LOC and head injury. She notes pain specifically to the right knee, inner thigh, groin, and hip. No alleviating factors noted; pt states there is no position in which she feels any relief. Pt also notes strain to the right upper leg 1 week ago following workout.   Past Medical History:  Diagnosis Date  . ADD (attention deficit disorder)   . GERD (gastroesophageal reflux disease)   . Thyroid disease     Patient Active Problem List   Diagnosis Date Noted  . Physical exam 03/10/2016  . Acute bacterial sinusitis 12/16/2015  . Maxillary sinusitis, acute 09/04/2015  . Hypothyroidism 06/23/2015  . Numbness of feet 04/14/2015  . Insomnia 02/11/2015  . Anxiety state 02/11/2015  . Polyarthralgia 02/11/2015  . Cough 10/20/2014  . Atypical chest pain 10/15/2014  . GERD (gastroesophageal reflux disease) 10/15/2014  . Fatigue 10/15/2014  . Dyspnea 10/08/2014  . S/P hysterectomy 04/14/2014  . Vaginal bleeding 05/10/2013  . ADD (attention deficit disorder) 01/29/2013    Past Surgical History:  Procedure Laterality Date  . ABDOMINAL HYSTERECTOMY    . APPENDECTOMY  1989  . LAPAROSCOPIC ASSISTED VAGINAL HYSTERECTOMY N/A  04/14/2014   Procedure: LAPAROSCOPIC ASSISTED VAGINAL HYSTERECTOMY;  Surgeon: Linda Hedges, DO;  Location: [facility];  Service: Gynecology;  Laterality: N/A;  . SALPINGOOPHORECTOMY Bilateral 04/14/2014   Procedure: Bilateral salpingectomy ;  Surgeon: Linda Hedges, DO;  Location: [facility];  Service: Gynecology;  Laterality: Bilateral;    OB History    No data available       Home Medications    Prior to Admission medications   Medication Sig Start Date End Date Taking? Authorizing Provider  cyclobenzaprine (FLEXERIL) 10 MG tablet Take 1 tablet (10 mg total) by mouth 3 (three) times daily as needed for muscle spasms. 04/20/16   Nicole Hacker, MD  DULoxetine (CYMBALTA) 60 MG capsule Take 1 capsule (60 mg total) by mouth daily. 10/30/15   Nicole Minium, MD  fluticasone (FLONASE) 50 MCG/ACT nasal spray Place 2 sprays into both nostrils daily. 12/16/15   Nicole Jeans, PA-C  levothyroxine (SYNTHROID, LEVOTHROID) 88 MCG tablet Take 1 tablet (88 mcg total) by mouth daily. 09/07/15   Nicole Minium, MD  naproxen (NAPROSYN) 500 MG tablet Take 1 tablet (500 mg total) by mouth 2 (two) times daily with a meal. 04/20/16   Nicole Hacker, MD  pantoprazole (PROTONIX) 40 MG tablet Take 1 tablet (40 mg total) by mouth daily. 03/10/16   Nicole Minium, MD  polyethylene glycol (MIRALAX / Floria Raveling) packet Take 17 g by mouth daily. One capful 02/11/15   Nicole Minium, MD  traZODone (DESYREL) 50 MG tablet Take 0.5-1 tablets (  25-50 mg total) by mouth at bedtime as needed for sleep. 03/10/16   Nicole Minium, MD    Family History Family History  Problem Relation Age of Onset  . Multiple sclerosis Mother   . Skin cancer Mother   . Breast cancer Maternal Grandmother   . Diabetes Maternal Grandmother   . Other Maternal Grandmother     Blood Cancer  . Irritable bowel syndrome Maternal Grandmother   . Colon polyps Maternal Grandmother   . High blood pressure Maternal Grandfather   . Heart  disease Maternal Grandfather   . Colon polyps Maternal Grandfather   . Heart attack Maternal Grandfather     in early 70s.   . Prostate cancer Maternal Uncle   . Celiac disease Daughter   . Colon cancer Maternal Uncle     questionable    Social History Social History  Substance Use Topics  . Smoking status: Former Smoker    Packs/day: 0.25    Years: 15.00    Types: Cigarettes    Quit date: 04/14/2010  . Smokeless tobacco: Never Used  . Alcohol use 0.0 oz/week     Comment: Socially     Allergies   Penicillins   Review of Systems Review of Systems  Musculoskeletal: Positive for arthralgias and myalgias.       RLE   Neurological: Positive for numbness. Negative for dizziness, syncope, weakness, light-headedness and headaches.  All other systems reviewed and are negative.  Physical Exam Updated Vital Signs BP 140/79 (BP Location: Right Arm)   Pulse 102   Temp 98.1 F (36.7 C) (Oral)   Resp 22   Ht 5\' 5"  (1.651 m)   Wt 185 lb (83.9 kg)   LMP 03/11/2014   SpO2 100%   BMI 30.79 kg/m   Physical Exam  Constitutional: She is oriented to person, place, and time. She appears well-developed and well-nourished.  Uncomfortable appearing  HENT:  Head: Normocephalic and atraumatic.  Cardiovascular: Normal rate, regular rhythm and normal heart sounds.   Pulmonary/Chest: Effort normal. No respiratory distress.  Musculoskeletal: Normal range of motion.  Normal range of motion of the right hip and right knee but increased pain, tenderness palpation over the adductor, no significant swelling or ecchymosis noted, no obvious deformities, 2+ DP pulse  Neurological: She is alert and oriented to person, place, and time.  Skin: Skin is warm and dry.  Psychiatric: She has a normal mood and affect.  Nursing note and vitals reviewed.    ED Treatments / Results  DIAGNOSTIC STUDIES:  Oxygen Saturation is 100% on RA, normal by my interpretation.    COORDINATION OF CARE:  11:14  PM Will order imaging and pain meds. Discussed treatment plan with pt at bedside and pt agreed to plan.  Labs (all labs ordered are listed, but only abnormal results are displayed) Labs Reviewed - No data to display  EKG  EKG Interpretation None       Radiology Dg Hip Unilat With Pelvis 2-3 Views Right  Result Date: 04/20/2016 CLINICAL DATA:  Severe right hip pain following fall last evening. No known injury. EXAM: DG HIP (WITH OR WITHOUT PELVIS) 2-3V RIGHT COMPARISON:  Right femur radiographs -earlier same day FINDINGS: No fracture or dislocation. Right hip joint spaces appear preserved. No evidence of avascular necrosis. Limited visualization of the pelvis is normal. Regional soft tissues appear normal. No radiopaque body. IMPRESSION: No explanation for patient's severe right hip pain. Specifically, no fracture or dislocation. Electronically Signed   By:  Sandi Mariscal M.D.   On: 04/20/2016 00:14  Dg Femur, Min 2 Views Right  Result Date: 04/20/2016 CLINICAL DATA:  Post fall, now with pain involving the right femur. EXAM: RIGHT FEMUR 2 VIEWS COMPARISON:  Right hip radiographs -earlier same day FINDINGS: No fracture or dislocation. Limited visualization of the adjacent hip and knee is normal given obliquity and large field of view. Regional soft tissues appear normal. No radiopaque foreign body. IMPRESSION: No explanation for patient's right leg pain. Specifically, no fracture or radiopaque foreign body. Electronically Signed   By: Sandi Mariscal M.D.   On: 04/20/2016 00:15   Procedures Procedures (including critical care time)  Medications Ordered in ED Medications  morphine 4 MG/ML injection 4 mg (4 mg Intravenous Given 04/19/16 2326)  cyclobenzaprine (FLEXERIL) tablet 5 mg (5 mg Oral Given 04/19/16 2325)  ketorolac (TORADOL) 30 MG/ML injection 30 mg (30 mg Intravenous Given 04/19/16 2324)  ondansetron (ZOFRAN) injection 4 mg (4 mg Intravenous Given 04/19/16 2323)     Initial Impression /  Assessment and Plan / ED Course  I have reviewed the triage vital signs and the nursing notes.  Pertinent labs & imaging results that were available during my care of the patient were reviewed by me and considered in my medical decision making (see chart for details).  Clinical Course  Patient presents with injury to the right leg. Uncomfortable appearing but nontoxic. No obvious deformities. Neurovascularly intact. Suspect muscle strain. However given severity of pain and pain at the hip and the knee, will obtain plain films to rule out avulsion fracture. Plain films are negative. Patient was given pain medication and muscle relaxant. On recheck, she is much more comfortable appearing. She is able to ambulate independently. She does have a limp. Ace wrap was placed for compression purposes. Suspect muscle strain. Will discharge home with naproxen and muscle relaxants. Rest, ice, compression, and elevation. Follow-up with sports medicine if symptoms persist.  Final Clinical Impressions(s) / ED Diagnoses   Final diagnoses:  Strain of adductor magnus muscle of right lower extremity, initial encounter    New Prescriptions New Prescriptions   CYCLOBENZAPRINE (FLEXERIL) 10 MG TABLET    Take 1 tablet (10 mg total) by mouth 3 (three) times daily as needed for muscle spasms.   NAPROXEN (NAPROSYN) 500 MG TABLET    Take 1 tablet (500 mg total) by mouth 2 (two) times daily with a meal.   I personally performed the services described in this documentation, which was scribed in my presence. The recorded information has been reviewed and is accurate.     Nicole Hacker, MD 04/20/16 [id number]

## 2016-04-20 ENCOUNTER — Other Ambulatory Visit: Payer: Self-pay | Admitting: Family Medicine

## 2016-04-20 ENCOUNTER — Ambulatory Visit (INDEPENDENT_AMBULATORY_CARE_PROVIDER_SITE_OTHER): Payer: BLUE CROSS/BLUE SHIELD | Admitting: Family Medicine

## 2016-04-20 ENCOUNTER — Encounter: Payer: Self-pay | Admitting: Family Medicine

## 2016-04-20 DIAGNOSIS — S8991XA Unspecified injury of right lower leg, initial encounter: Secondary | ICD-10-CM

## 2016-04-20 MED ORDER — CYCLOBENZAPRINE HCL 10 MG PO TABS: 10.0000 mg | ORAL_TABLET | Freq: Three times a day (TID) | ORAL | 0 refills | Status: DC | PRN

## 2016-04-20 MED ORDER — NAPROXEN 500 MG PO TABS: 500.0000 mg | ORAL_TABLET | Freq: Two times a day (BID) | ORAL | 0 refills | Status: DC

## 2016-04-20 MED ORDER — HYDROCODONE-ACETAMINOPHEN 5-325 MG PO TABS: 1.0000 | ORAL_TABLET | Freq: Four times a day (QID) | ORAL | 0 refills | Status: DC | PRN

## 2016-04-20 NOTE — Patient Instructions (Addendum)
You have a Grade 3 quad strain. Ice the area regularly for at least 72 hours (beyond this you can switch to heat if this feels more comfortable). Naproxen 500mg  twice a day with food for pain and inflammation for 7-10 days then as needed. Norco (hydrocodone) as needed for severe pain. Compression wrap for support and to help with the swelling. Crutches when up and around. Ok to do touch down weight bearing as tolerated. Simple motion exercises of knee and hip when tolerated also. Follow up with me in 2 weeks. Hopefully can start you in physical therapy at that time.

## 2016-04-20 NOTE — Discharge Instructions (Signed)
You likely have a muscle strain of the abductor muscle of your right leg. Rest, ice, compression, elevation. Anti-inflammatory medications and muscle relaxers provided. Follow-up with sports medicine if symptoms persist or worsen. Follow-up with primary physician or sports medicine prior to return to exercise.

## 2016-04-20 NOTE — ED Notes (Signed)
Pt verbalizes understanding of d/c instructions and denies any further needs at this time. 

## 2016-04-20 NOTE — ED Notes (Signed)
Steady gait ambulating with much favoring and severe limp, better than on arrival, pinpoints pain to R anterior thigh and anterior hip (denies: knee,ankle, buttocks, groin or back pain). Family at [facility]

## 2016-04-22 ENCOUNTER — Telehealth: Payer: Self-pay | Admitting: Family Medicine

## 2016-04-22 NOTE — Telephone Encounter (Signed)
This is ok with me.  Letter printed.

## 2016-04-25 NOTE — Progress Notes (Signed)
PCP: Annye Asa, MD  Subjective:   HPI: Patient is a 40 y.o. female here for right leg injury.  Patient reports on 7/25 she slipped on bottom step with right leg behind her, slid forward and felt severe sharp pain from right knee up to groin. + swelling. Pain was 10/10 but has improved some - using ACE wrap. Pain is 0/10 with no movement, up to 8/10 and sharp with moving hip or knee. Is taking an nsaid, muscle relaxant. No skin changes, numbness.  Past Medical History:  Diagnosis Date  . ADD (attention deficit disorder)   . GERD (gastroesophageal reflux disease)   . Thyroid disease     Current Outpatient Prescriptions on File Prior to Visit  Medication Sig Dispense Refill  . cyclobenzaprine (FLEXERIL) 10 MG tablet Take 1 tablet (10 mg total) by mouth 3 (three) times daily as needed for muscle spasms. 20 tablet 0  . DULoxetine (CYMBALTA) 60 MG capsule Take 1 capsule (60 mg total) by mouth daily. 30 capsule 3  . fluticasone (FLONASE) 50 MCG/ACT nasal spray Place 2 sprays into both nostrils daily. 16 g 6  . levothyroxine (SYNTHROID, LEVOTHROID) 88 MCG tablet Take 1 tablet (88 mcg total) by mouth daily. 30 tablet 6  . naproxen (NAPROSYN) 500 MG tablet Take 1 tablet (500 mg total) by mouth 2 (two) times daily with a meal. 30 tablet 0  . pantoprazole (PROTONIX) 40 MG tablet Take 1 tablet (40 mg total) by mouth daily. 30 tablet 3  . traZODone (DESYREL) 50 MG tablet Take 0.5-1 tablets (25-50 mg total) by mouth at bedtime as needed for sleep. 30 tablet 3   No current facility-administered medications on file prior to visit.     Past Surgical History:  Procedure Laterality Date  . ABDOMINAL HYSTERECTOMY    . APPENDECTOMY  1989  . LAPAROSCOPIC ASSISTED VAGINAL HYSTERECTOMY N/A 04/14/2014   Procedure: LAPAROSCOPIC ASSISTED VAGINAL HYSTERECTOMY;  Surgeon: Linda Hedges, DO;  Location: [facility];  Service: Gynecology;  Laterality: N/A;  . SALPINGOOPHORECTOMY Bilateral 04/14/2014   Procedure: Bilateral salpingectomy ;  Surgeon: Linda Hedges, DO;  Location: [facility];  Service: Gynecology;  Laterality: Bilateral;    Allergies  Allergen Reactions  . Penicillins Rash    Can tolerate Amoxicillin    Social History   Social History  . Marital status: Married    Spouse name: N/A  . Number of children: 2  . Years of education: N/A   Occupational History  . [employer]   Social History Main Topics  . Smoking status: Former Smoker    Packs/day: 0.25    Years: 15.00    Types: Cigarettes    Quit date: 04/14/2010  . Smokeless tobacco: Never Used  . Alcohol use 0.0 oz/week     Comment: Socially  . Drug use: No  . Sexual activity: Not on file   Other Topics Concern  . Not on file   Social History Narrative  . No narrative on file    Family History  Problem Relation Age of Onset  . Multiple sclerosis Mother   . Skin cancer Mother   . Breast cancer Maternal Grandmother   . Diabetes Maternal Grandmother   . Other Maternal Grandmother     Blood Cancer  . Irritable bowel syndrome Maternal Grandmother   . Colon polyps Maternal Grandmother   . High blood pressure Maternal Grandfather   . Heart disease Maternal Grandfather   . Colon polyps Maternal Grandfather   .  Heart attack Maternal Grandfather     in early 66s.   . Prostate cancer Maternal Uncle   . Celiac disease Daughter   . Colon cancer Maternal Uncle     questionable    BP 117/81   Pulse (!) 103   Ht 5\' 5"  (1.651 m)   Wt 185 lb (83.9 kg)   LMP 03/11/2014   BMI 30.79 kg/m   Review of Systems: See HPI above.    Objective:  Physical Exam:  Gen: NAD, comfortable in exam room  Right leg: No gross deformity, swelling, or bruising noted. Anterior proximal to mid quad tenderness, severe, medial tenderness over adductor magnus as well. No other tenderness of knee, hip. FROM hip passively, knee passively. Pain on attempted straight leg raise, knee extension - motion  very limited actively due to pain. No palpable defect in musculature. NVI distally.  Sensation intact.  Left leg: FROM knee and hip without pain.  MSK u/s right leg:  Disorganization of muscle fibers proximal medial rectus femoris with hematoma.  Remainder of quad and visualized adductor magnus today without obvious deformity or tear.    Assessment & Plan:  1. Quad strain - grade 3, confirmed with ultrasound.  Start with regular naproxen, compression, norco as needed.  Touch down weight bearing with crutches.  Shown motion exercises to do when tolerated.  F/u in 2 weeks.

## 2016-04-26 DIAGNOSIS — S8991XA Unspecified injury of right lower leg, initial encounter: Secondary | ICD-10-CM | POA: Insufficient documentation

## 2016-04-26 NOTE — Assessment & Plan Note (Signed)
Quad strain - grade 3, confirmed with ultrasound.  Start with regular naproxen, compression, norco as needed.  Touch down weight bearing with crutches.  Shown motion exercises to do when tolerated.  F/u in 2 weeks.

## 2016-04-28 ENCOUNTER — Encounter: Payer: Self-pay | Admitting: General Practice

## 2016-05-02 ENCOUNTER — Other Ambulatory Visit: Payer: Self-pay | Admitting: Family Medicine

## 2016-05-02 ENCOUNTER — Encounter: Payer: Self-pay | Admitting: Family Medicine

## 2016-05-02 ENCOUNTER — Ambulatory Visit (INDEPENDENT_AMBULATORY_CARE_PROVIDER_SITE_OTHER): Payer: BLUE CROSS/BLUE SHIELD | Admitting: Family Medicine

## 2016-05-02 DIAGNOSIS — S8991XD Unspecified injury of right lower leg, subsequent encounter: Secondary | ICD-10-CM

## 2016-05-02 NOTE — Patient Instructions (Signed)
You are doing well. At this point I would add physical therapy. Do home exercises and stretches they show you on days you don't go to therapy. Ice or heat as needed. Naproxen and norco only as needed now (as you have been). Compression wrap for support and to help with the swelling. Follow up with me in 1 month.

## 2016-05-03 NOTE — Progress Notes (Signed)
PCP: Annye Asa, MD  Subjective:   HPI: Patient is a 40 y.o. female here for right leg injury.  7/26: Patient reports on 7/25 she slipped on bottom step with right leg behind her, slid forward and felt severe sharp pain from right knee up to groin. + swelling. Pain was 10/10 but has improved some - using ACE wrap. Pain is 0/10 with no movement, up to 8/10 and sharp with moving hip or knee. Is taking an nsaid, muscle relaxant. No skin changes, numbness.  8/7: Patient has improved since last visit. Pain is now down to 0/10, up to 7/10 and sharp. Feels mainly in medial proximal right leg and groin now. Has been wrapping, takes pain medication and muscle relaxant at night. Able to walk much better. Associated spasms. No skin changes, numbness.  Past Medical History:  Diagnosis Date  . ADD (attention deficit disorder)   . GERD (gastroesophageal reflux disease)   . Thyroid disease     Current Outpatient Prescriptions on File Prior to Visit  Medication Sig Dispense Refill  . cyclobenzaprine (FLEXERIL) 10 MG tablet Take 1 tablet (10 mg total) by mouth 3 (three) times daily as needed for muscle spasms. 20 tablet 0  . fluticasone (FLONASE) 50 MCG/ACT nasal spray Place 2 sprays into both nostrils daily. 16 g 6  . HYDROcodone-acetaminophen (NORCO) 5-325 MG tablet Take 1-2 tablets by mouth every 6 (six) hours as needed for moderate pain. 40 tablet 0  . levothyroxine (SYNTHROID, LEVOTHROID) 88 MCG tablet Take 1 tablet (88 mcg total) by mouth daily. 30 tablet 6  . naproxen (NAPROSYN) 500 MG tablet Take 1 tablet (500 mg total) by mouth 2 (two) times daily with a meal. 30 tablet 0  . pantoprazole (PROTONIX) 40 MG tablet Take 1 tablet (40 mg total) by mouth daily. 30 tablet 3  . polyethylene glycol (MIRALAX / GLYCOLAX) packet MIX 17 GRAMS( 1 PACKET) IN LIQUID AND DRINK DAILY AS DIRECTED 100 packet 1  . traZODone (DESYREL) 50 MG tablet Take 0.5-1 tablets (25-50 mg total) by mouth at  bedtime as needed for sleep. 30 tablet 3   No current facility-administered medications on file prior to visit.     Past Surgical History:  Procedure Laterality Date  . ABDOMINAL HYSTERECTOMY    . APPENDECTOMY  1989  . LAPAROSCOPIC ASSISTED VAGINAL HYSTERECTOMY N/A 04/14/2014   Procedure: LAPAROSCOPIC ASSISTED VAGINAL HYSTERECTOMY;  Surgeon: Linda Hedges, DO;  Location: [facility];  Service: Gynecology;  Laterality: N/A;  . SALPINGOOPHORECTOMY Bilateral 04/14/2014   Procedure: Bilateral salpingectomy ;  Surgeon: Linda Hedges, DO;  Location: [facility];  Service: Gynecology;  Laterality: Bilateral;    Allergies  Allergen Reactions  . Penicillins Rash    Can tolerate Amoxicillin    Social History   Social History  . Marital status: Married    Spouse name: N/A  . Number of children: 2  . Years of education: N/A   Occupational History  . [employer]   Social History Main Topics  . Smoking status: Former Smoker    Packs/day: 0.25    Years: 15.00    Types: Cigarettes    Quit date: 04/14/2010  . Smokeless tobacco: Never Used  . Alcohol use 0.0 oz/week     Comment: Socially  . Drug use: No  . Sexual activity: Not on file   Other Topics Concern  . Not on file   Social History Narrative  . No narrative on file    Family History  Problem Relation Age of Onset  . Multiple sclerosis Mother   . Skin cancer Mother   . Breast cancer Maternal Grandmother   . Diabetes Maternal Grandmother   . Other Maternal Grandmother     Blood Cancer  . Irritable bowel syndrome Maternal Grandmother   . Colon polyps Maternal Grandmother   . High blood pressure Maternal Grandfather   . Heart disease Maternal Grandfather   . Colon polyps Maternal Grandfather   . Heart attack Maternal Grandfather     in early 15s.   . Prostate cancer Maternal Uncle   . Celiac disease Daughter   . Colon cancer Maternal Uncle     questionable    BP 124/81   Pulse (!) 106   Ht 5\' 5"   (1.651 m)   Wt 185 lb (83.9 kg)   LMP 03/11/2014   BMI 30.79 kg/m   Review of Systems: See HPI above.    Objective:  Physical Exam:  Gen: NAD, comfortable in exam room  Right leg: No gross deformity, swelling, or bruising. Anterior proximal to mid quad tenderness, moderate now, medial tenderness over adductor magnus as well. No other tenderness of knee, hip. FROM hip passively, knee passively.  3/5 strength of adduction, 4/5 straight leg raise - both painful. 5-/5 knee extension with minimal pain. No palpable defect in musculature. NVI distally.  Sensation intact.  Left leg: FROM knee and hip without pain.    Assessment & Plan:  1. Quad, adductor strain - grade 3, quad component confirmed with ultrasound.  Clinically improving.  Start physical therapy, home exercises.  Ice/heat.  Naproxen with norco as needed.  Compression wrap.  F/u in 1 month.

## 2016-05-03 NOTE — Assessment & Plan Note (Signed)
Quad, adductor strain - grade 3, quad component confirmed with ultrasound.  Clinically improving.  Start physical therapy, home exercises.  Ice/heat.  Naproxen with norco as needed.  Compression wrap.  F/u in 1 month.

## 2016-05-04 ENCOUNTER — Ambulatory Visit: Payer: BLUE CROSS/BLUE SHIELD | Admitting: Family Medicine

## 2016-05-11 LAB — TSH: TSH: 12.37 u[IU]/mL — AB (ref <=5.90)

## 2016-05-12 ENCOUNTER — Other Ambulatory Visit: Payer: BLUE CROSS/BLUE SHIELD

## 2016-05-17 ENCOUNTER — Encounter: Payer: Self-pay | Admitting: General Practice

## 2016-05-20 ENCOUNTER — Ambulatory Visit (INDEPENDENT_AMBULATORY_CARE_PROVIDER_SITE_OTHER): Payer: Self-pay | Admitting: Family Medicine

## 2016-05-20 ENCOUNTER — Encounter: Payer: Self-pay | Admitting: Family Medicine

## 2016-05-20 VITALS — BP 124/83 | HR 113 | Temp 99.0°F | Resp 17 | Wt 195.1 lb

## 2016-05-20 DIAGNOSIS — J0100 Acute maxillary sinusitis, unspecified: Secondary | ICD-10-CM

## 2016-05-20 DIAGNOSIS — R059 Cough, unspecified: Secondary | ICD-10-CM

## 2016-05-20 DIAGNOSIS — E038 Other specified hypothyroidism: Secondary | ICD-10-CM

## 2016-05-20 DIAGNOSIS — R05 Cough: Secondary | ICD-10-CM

## 2016-05-20 MED ORDER — ALBUTEROL SULFATE HFA 108 (90 BASE) MCG/ACT IN AERS: 2.0000 | INHALATION_SPRAY | Freq: Four times a day (QID) | RESPIRATORY_TRACT | 2 refills | Status: DC | PRN

## 2016-05-20 MED ORDER — PROMETHAZINE-DM 6.25-15 MG/5ML PO SYRP: 5.0000 mL | ORAL_SOLUTION | Freq: Four times a day (QID) | ORAL | 0 refills | Status: DC | PRN

## 2016-05-20 MED ORDER — ALBUTEROL SULFATE (2.5 MG/3ML) 0.083% IN NEBU
2.5000 mg | INHALATION_SOLUTION | Freq: Once | RESPIRATORY_TRACT | Status: AC
  Administered 2016-05-20: 2.5 mg via RESPIRATORY_TRACT

## 2016-05-20 MED ORDER — SULFAMETHOXAZOLE-TRIMETHOPRIM 800-160 MG PO TABS
1.0000 | ORAL_TABLET | Freq: Two times a day (BID) | ORAL | 0 refills | Status: DC
Start: 2016-05-20 — End: 2016-06-03

## 2016-05-20 MED ORDER — LEVOTHYROXINE SODIUM 112 MCG PO TABS: 112.0000 ug | ORAL_TABLET | Freq: Every day | ORAL | 3 refills | Status: DC

## 2016-05-20 NOTE — Patient Instructions (Signed)
Schedule a lab visit in 1 month to recheck thyroid Start the Bactrim twice daily- take w/ food- for sinus infection Drink plenty of fluids Mucinex DM for cough/congestion Cough syrup for nights/weekends- will cause drowsiness REST!!! Switch to the new thyroid dose- 135mcg daily Call with any questions or concerns Hang in there!!!

## 2016-05-20 NOTE — Addendum Note (Signed)
Addended by: Davis Gourd on: 05/20/2016 10:45 AM   Modules accepted: Orders

## 2016-05-20 NOTE — Assessment & Plan Note (Signed)
New.  Pt's sxs and PE consistent w/ infxn.  Start abx.  Cough meds prn.  Reviewed supportive care and red flags that should prompt return.  Pt expressed understanding and is in agreement w/ plan.

## 2016-05-20 NOTE — Assessment & Plan Note (Signed)
Chronic problem.  Pt's recent TSH was elevated at 12.37.  Increase levothyroxine dose to 141mcg daily and repeat TSH at lab only visit in 1 month.  Pt expressed understanding and is in agreement w/ plan.

## 2016-05-20 NOTE — Progress Notes (Signed)
   Subjective:    Patient ID: Nicole Perry, female    DOB: 1976/04/20, 40 y.o.   MRN: [id number]  HPI URI- sxs started 2 days ago w/ sore throat.  + sick contacts at work.  Now having chest tightness, dry hacking cough.  + subjective fever.  + HA- L sided w/ ear pain.  + nasal congestion.  No N/V.  No tooth pain.  Hypothyroid- chronic problem.  Recent labs at GYN show elevated TSH at 12.37.  Needs medication adjusted.   Review of Systems For ROS see HPI     Objective:   Physical Exam  Constitutional: She is oriented to person, place, and time. She appears well-developed and well-nourished. No distress.  HENT:  Head: Normocephalic and atraumatic.  Right Ear: Tympanic membrane normal.  Left Ear: Tympanic membrane normal.  Nose: Mucosal edema and rhinorrhea present. Right sinus exhibits no maxillary sinus tenderness and no frontal sinus tenderness. Left sinus exhibits maxillary sinus tenderness and frontal sinus tenderness.  Mouth/Throat: Uvula is midline and mucous membranes are normal. Posterior oropharyngeal erythema present. No oropharyngeal exudate.  Eyes: Conjunctivae and EOM are normal. Pupils are equal, round, and reactive to light.  Neck: Normal range of motion. Neck supple.  Cardiovascular: Normal rate, regular rhythm and normal heart sounds.   Pulmonary/Chest: Effort normal and breath sounds normal. No respiratory distress. She has no wheezes.  Dry, hacking cough w/ poor air movement throughout.  Improved s/p neb tx  Lymphadenopathy:    She has no cervical adenopathy.  Neurological: She is alert and oriented to person, place, and time.  Skin: Skin is warm and dry.  Psychiatric: She has a normal mood and affect. Her behavior is normal. Thought content normal.  Vitals reviewed.         Assessment & Plan:

## 2016-05-20 NOTE — Progress Notes (Signed)
Pre visit review using our clinic review tool, if applicable. No additional management support is needed unless otherwise documented below in the visit note. 

## 2016-06-02 ENCOUNTER — Ambulatory Visit: Payer: BLUE CROSS/BLUE SHIELD | Admitting: Family Medicine

## 2016-06-03 ENCOUNTER — Ambulatory Visit (INDEPENDENT_AMBULATORY_CARE_PROVIDER_SITE_OTHER): Payer: Managed Care, Other (non HMO) | Admitting: Family Medicine

## 2016-06-03 ENCOUNTER — Encounter: Payer: Self-pay | Admitting: General Practice

## 2016-06-03 ENCOUNTER — Encounter: Payer: Self-pay | Admitting: Family Medicine

## 2016-06-03 VITALS — BP 126/85 | HR 98 | Temp 98.9°F | Resp 18 | Ht 65.0 in | Wt 190.4 lb

## 2016-06-03 DIAGNOSIS — J209 Acute bronchitis, unspecified: Secondary | ICD-10-CM

## 2016-06-03 DIAGNOSIS — S8991XA Unspecified injury of right lower leg, initial encounter: Secondary | ICD-10-CM

## 2016-06-03 MED ORDER — PREDNISONE 10 MG PO TABS: ORAL_TABLET | ORAL | 0 refills | Status: DC

## 2016-06-03 MED ORDER — AZITHROMYCIN 250 MG PO TABS: ORAL_TABLET | ORAL | 0 refills | Status: DC

## 2016-06-03 MED ORDER — PROMETHAZINE-DM 6.25-15 MG/5ML PO SYRP: 5.0000 mL | ORAL_SOLUTION | Freq: Four times a day (QID) | ORAL | 0 refills | Status: DC | PRN

## 2016-06-03 MED ORDER — IPRATROPIUM-ALBUTEROL 0.5-2.5 (3) MG/3ML IN SOLN
3.0000 mL | Freq: Four times a day (QID) | RESPIRATORY_TRACT | Status: DC
Start: 2016-06-03 — End: 2016-06-03

## 2016-06-03 MED ORDER — IPRATROPIUM-ALBUTEROL 0.5-2.5 (3) MG/3ML IN SOLN
3.0000 mL | Freq: Once | RESPIRATORY_TRACT | Status: AC
  Administered 2016-06-03: 3 mL via RESPIRATORY_TRACT

## 2016-06-03 NOTE — Patient Instructions (Signed)
Follow up as needed Start the Zpack as directed for possible atypical infection Start the Prednisone taper as directed- take w/ food Drink plenty of fluids Continue to use the albuterol inhaler has needed for cough/wheezing Mucinex DM for daytime cough Continue the syrup as needed- best for nights/weekends b/c it will cause drowsiness Continue to clean wound w/ peroxide, pat dry, and then apply antibiotic ointment to the area Call with any questions or concerns Hang in there!!!

## 2016-06-03 NOTE — Progress Notes (Signed)
Pre visit review using our clinic review tool, if applicable. No additional management support is needed unless otherwise documented below in the visit note. 

## 2016-06-03 NOTE — Progress Notes (Signed)
   Subjective:    Patient ID: Nicole Perry, female    DOB: 1976/09/12, 40 y.o.   MRN: [id number]  HPI URI- pt was seen 8/25 and tx'd w/ Bactrim for sinus infxn.  Thought she was improving but sxs returned 6 days ago and worsened 3 days ago.  Cough is now the worst part- 'i will cough until I throw up'.  No fevers.  Pt has HA but feels this is due to cough.  Inhaler will temporarily relieve cough.  No relief w/ cough drops.  Subjective fever.  + sick contacts.  R leg wound- pt fell on her front step, scraping her anterior ankle.  She has been cleaning wound w/ peroxide, alcohol, and applying Neosporin.  Review of Systems For ROS see HPI     Objective:   Physical Exam  Constitutional: She is oriented to person, place, and time. She appears well-developed and well-nourished. No distress.  HENT:  Head: Normocephalic and atraumatic.  TMs normal bilaterally Mild nasal congestion Throat w/out erythema, edema, or exudate  Eyes: Conjunctivae and EOM are normal. Pupils are equal, round, and reactive to light.  Neck: Normal range of motion. Neck supple.  Cardiovascular: Normal rate, regular rhythm, normal heart sounds and intact distal pulses.   No murmur heard. Pulmonary/Chest: Effort normal and breath sounds normal. No respiratory distress. She has no wheezes.  + hacking cough  Lymphadenopathy:    She has no cervical adenopathy.  Neurological: She is alert and oriented to person, place, and time.  Skin: Skin is warm and dry. No erythema.  2x2 cm abrasion to R anterior ankle w/o evidence of infxn  Psychiatric: She has a normal mood and affect. Her behavior is normal. Thought content normal.  Vitals reviewed.         Assessment & Plan:  Bronchitis- pt's sinus sxs improved w/ Bactrim but now w/ hacking cough.  Some improvement w/ neb tx in office.  Start Zpack for possible atypical infxn.  Steroids to improve airway inflammation which is likely the main cause of cough.  Continue  albuterol inhaler.  Cough meds prn.  Reviewed supportive care and red flags that should prompt return.  Pt expressed understanding and is in agreement w/ plan.   Leg wound- no evidence of infxn. Reviewed wound care w/ pt.  Pt expressed understanding and is in agreement w/ plan.

## 2016-06-17 ENCOUNTER — Other Ambulatory Visit: Payer: BLUE CROSS/BLUE SHIELD

## 2016-07-28 ENCOUNTER — Ambulatory Visit: Payer: Managed Care, Other (non HMO) | Admitting: Family Medicine

## 2016-11-03 ENCOUNTER — Telehealth: Payer: Self-pay | Admitting: Family Medicine

## 2016-11-03 NOTE — Telephone Encounter (Signed)
LM requesting call back regarding symptoms and appointment. Offering appt with PCP today at 3:30 if still available.

## 2016-11-03 NOTE — Telephone Encounter (Signed)
PLEASE NOTE: All timestamps contained within this report are represented as [place] Standard Time. CONFIDENTIALTY NOTICE: This fax transmission is intended only for the addressee. It contains information that is legally privileged, confidential or otherwise protected from use or disclosure. If you are not the intended recipient, you are strictly prohibited from reviewing, disclosing, copying using or disseminating any of this information or taking any action in reliance on or regarding this information. If you have received this fax in error, please notify us immediately by telephone so that we can arrange for its return to [place]. Phone: [phone], Toll-Free: [phone], Fax: [phone] Page: 1 of 1 Call Id: [id number] [place] Patient Name: Nicole Perry DOB: 09-14-1976 Initial Comment headaches, body aches, diarrhea, fever 100.3 Nurse Assessment Nurse: Marcelline Deist, RN, Lynda Date/Time (Eastern Time): 11/03/2016 12:26:08 PM Confirm and document reason for call. If symptomatic, describe symptoms. ---Caller states she had vomiting, headaches, body aches, diarrhea, cough, fever 100.3, started suddenly on Tuesday. Vomiting stopped after 2 days, still has nausea. Her dtr. was dx with flu 3 weeks ago. Taking Tylenol. Does the patient have any new or worsening symptoms? ---Yes Will a triage be completed? ---Yes Related visit to physician within the last 2 weeks? ---No Does the PT have any chronic conditions? (i.e. diabetes, asthma, etc.) ---Yes List chronic conditions. ---thyroid rx Is the patient pregnant or possibly pregnant? (Ask all females between the ages of 10-55) ---No Is this a behavioral health or substance abuse call? ---No Guidelines Guideline Title Affirmed Question Affirmed Notes Influenza - Seasonal [1] Probable influenza (fever) with no complications AND [id number] NOT HIGH RISK (all  triage questions negative) Final Disposition Bowen, RN, Kermit Balo Comments Caller advised to call back if fever continues past 3 days, if diarrhea or headache worsens. She will need documentation since she missed a couple days of work. Disagree/Comply: Comply

## 2016-12-08 ENCOUNTER — Encounter: Payer: Self-pay | Admitting: General Practice

## 2016-12-08 ENCOUNTER — Ambulatory Visit (INDEPENDENT_AMBULATORY_CARE_PROVIDER_SITE_OTHER): Payer: Managed Care, Other (non HMO) | Admitting: Family Medicine

## 2016-12-08 ENCOUNTER — Encounter: Payer: Self-pay | Admitting: Family Medicine

## 2016-12-08 VITALS — BP 128/86 | HR 116 | Temp 99.9°F | Resp 17 | Ht 65.0 in | Wt 195.1 lb

## 2016-12-08 DIAGNOSIS — J111 Influenza due to unidentified influenza virus with other respiratory manifestations: Secondary | ICD-10-CM | POA: Diagnosis not present

## 2016-12-08 DIAGNOSIS — R509 Fever, unspecified: Secondary | ICD-10-CM | POA: Diagnosis not present

## 2016-12-08 LAB — POCT INFLUENZA A: Rapid Influenza A Ag: POSITIVE

## 2016-12-08 MED ORDER — OSELTAMIVIR PHOSPHATE 75 MG PO CAPS: 75.0000 mg | ORAL_CAPSULE | Freq: Two times a day (BID) | ORAL | 0 refills | Status: DC

## 2016-12-08 MED ORDER — GUAIFENESIN-CODEINE 100-10 MG/5ML PO SYRP: 10.0000 mL | ORAL_SOLUTION | Freq: Three times a day (TID) | ORAL | 0 refills | Status: DC | PRN

## 2016-12-08 NOTE — Progress Notes (Signed)
Pre visit review using our clinic review tool, if applicable. No additional management support is needed unless otherwise documented below in the visit note. 

## 2016-12-08 NOTE — Progress Notes (Signed)
   Subjective:    Patient ID: Nicole Perry, female    DOB: [birth year], 41 y.o.   MRN: [id number]  HPI Fever- sxs started Monday w/ cough and low grade fever.  sxs suddenly worsened yesterday.  Last night had temp to 102.6  + dry cough, chest congestion, nasal congestion.  No body aches.  No sinus pain/pressure. Mild sore throat.  + cervical LAD.  L ear pain yesterday.  + sick contacts.   Review of Systems For ROS see HPI     Objective:   Physical Exam  Constitutional: She is oriented to person, place, and time. She appears well-developed and well-nourished. No distress.  Obviously not feeling well  HENT:  Head: Normocephalic and atraumatic.  TMs normal bilaterally Mild nasal congestion Throat w/out erythema, edema, or exudate  Eyes: Conjunctivae and EOM are normal. Pupils are equal, round, and reactive to light.  Neck: Normal range of motion. Neck supple.  Cardiovascular: Normal rate, regular rhythm, normal heart sounds and intact distal pulses.   No murmur heard. Pulmonary/Chest: Effort normal and breath sounds normal. No respiratory distress. She has no wheezes.  + hacking cough  Lymphadenopathy:    She has no cervical adenopathy.  Neurological: She is alert and oriented to person, place, and time.  Skin: Skin is warm and dry. No rash noted.  Psychiatric: She has a normal mood and affect. Her behavior is normal. Thought content normal.  Vitals reviewed.         Assessment & Plan:  Influenza- new.  Pt's fever and cough are concerning for flu.  Test confirms.  Start Tamiflu.  Cough meds prn.  No evidence of PNA on PE so no need for abx.  Reviewed supportive care and red flags that should prompt return.  Pt expressed understanding and is in agreement w/ plan.

## 2016-12-08 NOTE — Patient Instructions (Signed)
You have the flu! Start the Tamiflu twice daily- take w/ food Drink plenty of fluids REST! Use the codeine cough syrup as needed for cough/congestion- will cause drowsiness Mucinex DM for daytime cough w/o drowsiness Call with any questions or concerns Hang in there!!!

## 2016-12-30 ENCOUNTER — Encounter: Payer: Self-pay | Admitting: Family Medicine

## 2016-12-30 DIAGNOSIS — E038 Other specified hypothyroidism: Secondary | ICD-10-CM

## 2017-01-02 ENCOUNTER — Ambulatory Visit (INDEPENDENT_AMBULATORY_CARE_PROVIDER_SITE_OTHER): Payer: Managed Care, Other (non HMO) | Admitting: Family Medicine

## 2017-01-02 ENCOUNTER — Encounter: Payer: Self-pay | Admitting: Family Medicine

## 2017-01-02 VITALS — BP 123/83 | HR 93 | Temp 98.4°F | Resp 17 | Ht 65.0 in | Wt 200.4 lb

## 2017-01-02 DIAGNOSIS — R05 Cough: Secondary | ICD-10-CM

## 2017-01-02 DIAGNOSIS — R059 Cough, unspecified: Secondary | ICD-10-CM

## 2017-01-02 MED ORDER — PREDNISONE 10 MG PO TABS: ORAL_TABLET | ORAL | 0 refills | Status: DC

## 2017-01-02 MED ORDER — ALBUTEROL SULFATE HFA 108 (90 BASE) MCG/ACT IN AERS: 2.0000 | INHALATION_SPRAY | Freq: Four times a day (QID) | RESPIRATORY_TRACT | 2 refills | Status: DC | PRN

## 2017-01-02 NOTE — Progress Notes (Signed)
   Subjective:    Patient ID: Nicole Perry, female    DOB: 11/23/1975, 41 y.o.   MRN: [id number]  HPI Cough- pt was dx'd last month w/ flu and even though cough continued, it was improving.  A few weeks ago, cough again worsened.  'it's just constant'.  Cough is intermittently productive.  No fevers.  + fatigue but otherwise feeling well.  + nasal congestion.  Denies sinus pressure.  Intermittent L ear pain.   Review of Systems For ROS see HPI     Objective:   Physical Exam  Constitutional: She appears well-developed and well-nourished. No distress.  HENT:  Head: Normocephalic and atraumatic.  TMs normal bilaterally Mild nasal congestion Throat w/out erythema, edema, or exudate  Eyes: Conjunctivae and EOM are normal. Pupils are equal, round, and reactive to light.  Neck: Normal range of motion. Neck supple.  Cardiovascular: Normal rate, regular rhythm, normal heart sounds and intact distal pulses.   No murmur heard. Pulmonary/Chest: Effort normal and breath sounds normal. No respiratory distress. She has no wheezes.  + hacking cough  Lymphadenopathy:    She has no cervical adenopathy.  Vitals reviewed.         Assessment & Plan:

## 2017-01-02 NOTE — Progress Notes (Signed)
Pre visit review using our clinic review tool, if applicable. No additional management support is needed unless otherwise documented below in the visit note. 

## 2017-01-02 NOTE — Telephone Encounter (Signed)
Ok to order, pt was seen 11/2016 (flu)

## 2017-01-02 NOTE — Assessment & Plan Note (Signed)
Pt's cough is consistent w/ post-infectious cough.  No evidence of infxn that would require abx.  Start steroids.  Albuterol prn.  Reviewed supportive care and red flags that should prompt return.  Pt expressed understanding and is in agreement w/ plan.

## 2017-01-02 NOTE — Patient Instructions (Signed)
Follow up as needed/scheduled Start the Prednisone as directed- take w/ food Use the albuterol inhaler when you have a coughing fit- this will improve the inflammation and break the spasm Drink plenty of fluids Call with any questions or concerns Hang in there!!!

## 2017-01-25 ENCOUNTER — Encounter: Payer: Self-pay | Admitting: Family Medicine

## 2017-01-25 ENCOUNTER — Ambulatory Visit (INDEPENDENT_AMBULATORY_CARE_PROVIDER_SITE_OTHER): Payer: Managed Care, Other (non HMO) | Admitting: Family Medicine

## 2017-01-25 VITALS — BP 126/86 | HR 114 | Temp 99.2°F | Resp 18 | Ht 65.0 in | Wt 195.1 lb

## 2017-01-25 DIAGNOSIS — R509 Fever, unspecified: Secondary | ICD-10-CM

## 2017-01-25 DIAGNOSIS — J0100 Acute maxillary sinusitis, unspecified: Secondary | ICD-10-CM

## 2017-01-25 LAB — POC INFLUENZA A&B (BINAX/QUICKVUE)
Influenza A, POC: NEGATIVE
Influenza B, POC: NEGATIVE

## 2017-01-25 MED ORDER — DOXYCYCLINE HYCLATE 100 MG PO TABS: 100.0000 mg | ORAL_TABLET | Freq: Two times a day (BID) | ORAL | 0 refills | Status: DC

## 2017-01-25 MED ORDER — PROMETHAZINE-DM 6.25-15 MG/5ML PO SYRP: 5.0000 mL | ORAL_SOLUTION | Freq: Four times a day (QID) | ORAL | 0 refills | Status: DC | PRN

## 2017-01-25 NOTE — Assessment & Plan Note (Signed)
Pt's sxs and PE consistent w/ infxn.  She is requesting a flu test to rule this out and determine whether she needs Tamiflu.  Start abx.  Cough meds prn.  Reviewed supportive care and red flags that should prompt return.  Pt expressed understanding and is in agreement w/ plan.

## 2017-01-25 NOTE — Progress Notes (Signed)
Pre visit review using our clinic review tool, if applicable. No additional management support is needed unless otherwise documented below in the visit note. 

## 2017-01-25 NOTE — Progress Notes (Signed)
   Subjective:    Patient ID: Nicole Perry, female    DOB: [birth year], 41 y.o.   MRN: [id number]  HPI URI- sxs started Sunday w/ worsening cough.  Developed sore throat on Monday.  After work had low grade temp.  Pt reports she has similar sxs to when she had the flu.  + sinus pressure, L ear pain.  Cough is non-productive.  Pt had facial pain w/ bending forward.  + sick contacts.  No tooth pain.  No N/V.   Review of Systems For ROS see HPI     Objective:   Physical Exam  Constitutional: She appears well-developed and well-nourished. No distress.  HENT:  Head: Normocephalic and atraumatic.  Right Ear: Tympanic membrane normal.  Left Ear: Tympanic membrane normal.  Nose: Mucosal edema and rhinorrhea present. Right sinus exhibits maxillary sinus tenderness and frontal sinus tenderness. Left sinus exhibits maxillary sinus tenderness and frontal sinus tenderness.  Mouth/Throat: Uvula is midline and mucous membranes are normal. Posterior oropharyngeal erythema present. No oropharyngeal exudate.  Eyes: Conjunctivae and EOM are normal. Pupils are equal, round, and reactive to light.  Neck: Normal range of motion. Neck supple.  Cardiovascular: Normal rate, regular rhythm and normal heart sounds.   Pulmonary/Chest: Effort normal and breath sounds normal. No respiratory distress. She has no wheezes.  + hacking cough  Lymphadenopathy:    She has no cervical adenopathy.  Vitals reviewed.         Assessment & Plan:

## 2017-01-25 NOTE — Patient Instructions (Signed)
Follow up as needed/scheduled Start the Doxycycline twice daily- w/ food- for the sinus infection Use the cough syrup as needed- will cause drowsiness Mucinex DM for daytime cough/congestion Drink plenty of fluids REST! Call with any questions or concerns Hang in there!!

## 2017-02-10 ENCOUNTER — Encounter: Payer: Self-pay | Admitting: Family Medicine

## 2017-05-06 ENCOUNTER — Other Ambulatory Visit: Payer: Self-pay | Admitting: Family Medicine

## 2017-05-17 ENCOUNTER — Other Ambulatory Visit: Payer: Self-pay | Admitting: Obstetrics & Gynecology

## 2017-05-17 DIAGNOSIS — N63 Unspecified lump in unspecified breast: Secondary | ICD-10-CM

## 2017-05-24 ENCOUNTER — Other Ambulatory Visit: Payer: Managed Care, Other (non HMO)

## 2017-06-12 ENCOUNTER — Ambulatory Visit
Admission: RE | Admit: 2017-06-12 | Discharge: 2017-06-12 | Disposition: A | Discharge status: Home / Self Care | Payer: Managed Care, Other (non HMO) | Source: Ambulatory Visit | Attending: Obstetrics & Gynecology | Admitting: Obstetrics & Gynecology

## 2017-06-12 DIAGNOSIS — N63 Unspecified lump in unspecified breast: Secondary | ICD-10-CM

## 2017-06-21 ENCOUNTER — Ambulatory Visit (INDEPENDENT_AMBULATORY_CARE_PROVIDER_SITE_OTHER): Payer: Managed Care, Other (non HMO) | Admitting: Family Medicine

## 2017-06-21 ENCOUNTER — Encounter: Payer: Self-pay | Admitting: Family Medicine

## 2017-06-21 VITALS — BP 121/81 | HR 103 | Temp 98.6°F | Resp 17 | Ht 65.0 in | Wt 172.2 lb

## 2017-06-21 DIAGNOSIS — R5383 Other fatigue: Secondary | ICD-10-CM | POA: Diagnosis not present

## 2017-06-21 DIAGNOSIS — F411 Generalized anxiety disorder: Secondary | ICD-10-CM

## 2017-06-21 DIAGNOSIS — G4700 Insomnia, unspecified: Secondary | ICD-10-CM

## 2017-06-21 LAB — CBC WITH DIFFERENTIAL/PLATELET
Basophils Absolute: 0.1 10*3/uL (ref 0.0–0.1)
Basophils Relative: 1 % (ref 0.0–3.0)
Eosinophils Absolute: 0.1 10*3/uL (ref 0.0–0.7)
Eosinophils Relative: 1.3 % (ref 0.0–5.0)
HCT: 43.8 % (ref 36.0–46.0)
Hemoglobin: 14.6 g/dL (ref 12.0–15.0)
Lymphocytes Relative: 26.9 % (ref 12.0–46.0)
Lymphs Abs: 1.7 10*3/uL (ref 0.7–4.0)
MCHC: 33.3 g/dL (ref 30.0–36.0)
MCV: 91.5 fl (ref 78.0–100.0)
Monocytes Absolute: 0.4 10*3/uL (ref 0.1–1.0)
Monocytes Relative: 6.1 % (ref 3.0–12.0)
Neutro Abs: 4.1 10*3/uL (ref 1.4–7.7)
Neutrophils Relative %: 64.7 % (ref 43.0–77.0)
Platelets: 318 10*3/uL (ref 150.0–400.0)
RBC: 4.79 Mil/uL (ref 3.87–5.11)
RDW: 13.1 % (ref 11.5–15.5)
WBC: 6.3 10*3/uL (ref 4.0–10.5)

## 2017-06-21 LAB — HEPATIC FUNCTION PANEL
ALT: 23 U/L (ref 0–35)
AST: 17 U/L (ref 0–37)
Albumin: 4.3 g/dL (ref 3.5–5.2)
Alkaline Phosphatase: 95 U/L (ref 39–117)
Bilirubin, Direct: 0.1 mg/dL (ref 0.0–0.3)
Total Bilirubin: 0.3 mg/dL (ref 0.2–1.2)
Total Protein: 6.7 g/dL (ref 6.0–8.3)

## 2017-06-21 LAB — BASIC METABOLIC PANEL
BUN: 10 mg/dL (ref 6–23)
CO2: 31 mEq/L (ref 19–32)
Calcium: 9.5 mg/dL (ref 8.4–10.5)
Chloride: 104 mEq/L (ref 96–112)
Creatinine, Ser: 0.63 mg/dL (ref 0.40–1.20)
GFR: 109.53 mL/min (ref >60.00)
Glucose, Bld: 89 mg/dL (ref 70–99)
Potassium: 4.1 mEq/L (ref 3.5–5.1)
Sodium: 140 mEq/L (ref 135–145)

## 2017-06-21 LAB — VITAMIN B12: Vitamin B-12: 537 pg/mL (ref 211–911)

## 2017-06-21 LAB — VITAMIN D 25 HYDROXY (VIT D DEFICIENCY, FRACTURES): VITD: 24.34 ng/mL — ABNORMAL LOW (ref 30.00–100.00)

## 2017-06-21 MED ORDER — POLYETHYLENE GLYCOL 3350 17 G PO PACK: 17.0000 g | PACK | Freq: Every day | ORAL | 3 refills | Status: DC

## 2017-06-21 MED ORDER — BUPROPION HCL ER (XL) 150 MG PO TB24: 150.0000 mg | ORAL_TABLET | Freq: Every day | ORAL | 3 refills | Status: DC

## 2017-06-21 MED ORDER — ALPRAZOLAM 0.5 MG PO TABS: 0.5000 mg | ORAL_TABLET | Freq: Two times a day (BID) | ORAL | 1 refills | Status: DC | PRN

## 2017-06-21 NOTE — Assessment & Plan Note (Signed)
Deteriorated.  Suspect this is b/c her anxiety and depression have both deteriorated significantly.  No sleeping meds at this time but will add Wellbutrin to improve her depressive sxs.  Pt expressed understanding and is in agreement w/ plan.

## 2017-06-21 NOTE — Assessment & Plan Note (Signed)
Deteriorated.  Suspect this is due to her worsening depressive sxs.  Check labs to r/o metabolic cause.  Add Wellbutrin daily.  Will follow.

## 2017-06-21 NOTE — Progress Notes (Signed)
Pre visit review using our clinic review tool, if applicable. No additional management support is needed unless otherwise documented below in the visit note. 

## 2017-06-21 NOTE — Assessment & Plan Note (Signed)
Deteriorated.  Pt is tearful, anxious and having a difficult time composing herself today.  Add Wellbutrin to her current Cymbalta.  Alprazolam as needed for panicked moments.  Will follow closely.

## 2017-06-21 NOTE — Patient Instructions (Signed)
Follow up in 1 month to recheck anxiety We'll notify you of your lab results and make any changes if needed Continue the Cymbalta daily Add the Wellbutrin once daily in the AM Use the Alprazolam as needed for those panicked moments- start w/ 1/2 tab and increase to 1 tab as needed Think about individual counseling to process some of these emotions that you're dealing with Call with any questions or concerns We are here to help!!!

## 2017-06-21 NOTE — Progress Notes (Signed)
   Subjective:    Patient ID: Nicole Perry, female    DOB: 1975-12-11, 41 y.o.   MRN: [id number]  HPI Fatigue- pt reports she has been 'exhausted' for the last 3 weeks.  Intermittent nausea, 'just not feeling good'.  HA x4 days. Pt reports fragmented sleep at night.  Intermittent palpitations.  Pt has an episode while driving where it felt like 'the walls were closing in' and 'jittery' afterwards.  Pt finds herself 'withdrawing more and more.  I just don't want to leave the house or my bed'.  Saw Endo on Friday and labs were WNL.  Pt hasn't been to work this week.  Pt reports she is crying frequently.  Work is stressful, she is dealing with husband's infidelity (they are still together).  Pt reports she is not able to go to counseling due to financial issues.   Review of Systems For ROS see HPI     Objective:   Physical Exam  Constitutional: She is oriented to person, place, and time. She appears well-developed and well-nourished. No distress.  HENT:  Head: Normocephalic and atraumatic.  Eyes: Pupils are equal, round, and reactive to light. Conjunctivae and EOM are normal.  Neck: Normal range of motion. Neck supple. No thyromegaly present.  Cardiovascular: Normal rate, regular rhythm, normal heart sounds and intact distal pulses.   No murmur heard. Pulmonary/Chest: Effort normal and breath sounds normal. No respiratory distress.  Abdominal: Soft. She exhibits no distension. There is no tenderness.  Musculoskeletal: She exhibits no edema.  Lymphadenopathy:    She has no cervical adenopathy.  Neurological: She is alert and oriented to person, place, and time.  Skin: Skin is warm and dry.  Psychiatric: She has a normal mood and affect. Her behavior is normal.  Vitals reviewed.         Assessment & Plan:

## 2017-06-22 ENCOUNTER — Other Ambulatory Visit: Payer: Self-pay | Admitting: General Practice

## 2017-06-22 ENCOUNTER — Encounter: Payer: Self-pay | Admitting: Family Medicine

## 2017-06-22 MED ORDER — VITAMIN D (ERGOCALCIFEROL) 1.25 MG (50000 UNIT) PO CAPS: 50000.0000 [IU] | ORAL_CAPSULE | ORAL | 0 refills | Status: DC

## 2017-06-28 ENCOUNTER — Telehealth: Payer: Self-pay | Admitting: Family Medicine

## 2017-06-28 ENCOUNTER — Encounter: Payer: Self-pay | Admitting: Family Medicine

## 2017-06-28 NOTE — Telephone Encounter (Signed)
You can schedule her with me later this week, but I cannot guarantee FMLA paperwork. Will depend on the issue for her leave, etc. If her issue fits need for FMLA I will gladly complete.

## 2017-06-28 NOTE — Telephone Encounter (Signed)
Patient states she was advised via MyChart to call office to schedule an appt for FMLA form to be completed.  Patient is requesting an appt for next week with pcp as this is time sensitive and her job in on the line.  Please advised if patient can be worked in next week with pcp or if she can be scheduled with [facility] for this.

## 2017-06-28 NOTE — Telephone Encounter (Signed)
Patient notified of PCP recommendations and is agreement and expresses an understanding.  

## 2017-06-28 NOTE — Telephone Encounter (Signed)
Pt was advised that FMLA need would be determined at this appt. Stated an understanding.

## 2017-06-28 NOTE — Telephone Encounter (Signed)
Please advise, I know per mychart message you advised she would have to see PCP. PCP schedule for next week is blocked, I am not sure how to schedule pt?

## 2017-06-30 ENCOUNTER — Ambulatory Visit: Payer: Managed Care, Other (non HMO) | Admitting: Physician Assistant

## 2017-07-03 ENCOUNTER — Encounter: Payer: Self-pay | Admitting: Physician Assistant

## 2017-07-03 ENCOUNTER — Ambulatory Visit (INDEPENDENT_AMBULATORY_CARE_PROVIDER_SITE_OTHER): Payer: Managed Care, Other (non HMO) | Admitting: Physician Assistant

## 2017-07-03 VITALS — BP 104/78 | HR 88 | Temp 98.3°F | Resp 14 | Ht 65.0 in | Wt 172.0 lb

## 2017-07-03 DIAGNOSIS — F329 Major depressive disorder, single episode, unspecified: Secondary | ICD-10-CM | POA: Diagnosis not present

## 2017-07-03 DIAGNOSIS — F419 Anxiety disorder, unspecified: Secondary | ICD-10-CM | POA: Diagnosis not present

## 2017-07-03 DIAGNOSIS — F32A Depression, unspecified: Secondary | ICD-10-CM

## 2017-07-03 NOTE — Patient Instructions (Signed)
Please continue the current regimen. You can use the Alprazolam to help with sleep on rough nights. Increase the Wellbutrin to 300 mg daily. We will send in a new script. Follow-up with Dr. Birdie Riddle in 1 week.  For FMLA -- I will write you out from today through next appointment. I will be discussing FMLA for the time between your last visit and today with Dr. Birdie Riddle.  Will let you know as soon as things are taken care of.

## 2017-07-03 NOTE — Progress Notes (Signed)
Patient presents to clinic today requesting FMLA secondary to depression and anxiety. Patient has been out of work since 06/19/2017. Endorses depressed mood to the level of having to use all of her energy to get out of bed. Endorses some occasional panic attack (mainly with driving). Denies generalized anxiety. Denies SI/HI. Was seen by PCP on 06/21/17 at which time Wellbutrin XL was added to help with depression. Is taking as directed and tolerating well. Has not noted significant chang yet with regimen change.   Past Medical History:  Diagnosis Date  . ADD (attention deficit disorder)   . GERD (gastroesophageal reflux disease)   . Thyroid disease     Current Outpatient Prescriptions on File Prior to Visit  Medication Sig Dispense Refill  . ALPRAZolam (XANAX) 0.5 MG tablet Take 1 tablet (0.5 mg total) by mouth 2 (two) times daily as needed for anxiety. 30 tablet 1  . buPROPion (WELLBUTRIN XL) 150 MG 24 hr tablet Take 1 tablet (150 mg total) by mouth daily. 30 tablet 3  . DULoxetine (CYMBALTA) 60 MG capsule TAKE 1 CAPSULE(60 MG) BY MOUTH DAILY 30 capsule 6  . fluticasone (FLONASE) 50 MCG/ACT nasal spray Place 2 sprays into both nostrils daily. 16 g 6  . levothyroxine (SYNTHROID, LEVOTHROID) 125 MCG tablet Take 125 mcg by mouth daily before breakfast.    . pantoprazole (PROTONIX) 40 MG tablet Take 1 tablet (40 mg total) by mouth daily. 30 tablet 3  . polyethylene glycol (MIRALAX / GLYCOLAX) packet Take 17 g by mouth daily. One capful 100 each 3  . Vitamin D, Ergocalciferol, (DRISDOL) 50000 units CAPS capsule Take 1 capsule (50,000 Units total) by mouth every 7 (seven) days. 12 capsule 0   No current facility-administered medications on file prior to visit.     Allergies  Allergen Reactions  . Penicillins Rash    Can tolerate Amoxicillin    Family History  Problem Relation Age of Onset  . Multiple sclerosis Mother   . Skin cancer Mother   . Breast cancer Maternal Grandmother   .  Diabetes Maternal Grandmother   . Other Maternal Grandmother        Blood Cancer  . Irritable bowel syndrome Maternal Grandmother   . Colon polyps Maternal Grandmother   . High blood pressure Maternal Grandfather   . Heart disease Maternal Grandfather   . Colon polyps Maternal Grandfather   . Heart attack Maternal Grandfather        in early 32s.   . Prostate cancer Maternal Uncle   . Celiac disease Daughter   . Colon cancer Maternal Uncle        questionable    Social History   Social History  . Marital status: Married    Spouse name: N/A  . Number of children: 2  . Years of education: N/A   Occupational History  . [employer]   Social History Main Topics  . Smoking status: Former Smoker    Packs/day: 0.25    Years: 15.00    Types: Cigarettes    Quit date: 04/14/2010  . Smokeless tobacco: Never Used  . Alcohol use 0.0 oz/week     Comment: Socially  . Drug use: No  . Sexual activity: Not Asked   Other Topics Concern  . None   Social History Narrative  . None    Review of Systems - See HPI.  All other ROS are negative.  BP 104/78   Pulse 88   Temp  98.3 F (36.8 C) (Oral)   Resp 14   Ht 5\' 5"  (1.651 m)   Wt 172 lb (78 kg)   LMP 03/11/2014   SpO2 99%   BMI 28.62 kg/m   Physical Exam  Constitutional: She is oriented to person, place, and time and well-developed, well-nourished, and in no distress.  HENT:  Head: Normocephalic and atraumatic.  Eyes: Conjunctivae are normal.  Neck: Neck supple.  Cardiovascular: Normal rate, regular rhythm, normal heart sounds and intact distal pulses.   Pulmonary/Chest: Effort normal and breath sounds normal. No respiratory distress. She has no wheezes. She has no rales. She exhibits no tenderness.  Neurological: She is alert and oriented to person, place, and time.  Skin: Skin is warm and dry. No rash noted.  Psychiatric: Affect normal.  Vitals reviewed.  Recent Results (from the past 2160  hour(s))  Basic metabolic panel     Status: None   Collection Time: 06/21/17 10:49 AM  Result Value Ref Range   Sodium 140 135 - 145 mEq/L   Potassium 4.1 3.5 - 5.1 mEq/L   Chloride 104 96 - 112 mEq/L   CO2 31 19 - 32 mEq/L   Glucose, Bld 89 70 - 99 mg/dL   BUN 10 6 - 23 mg/dL   Creatinine, Ser 0.63 0.40 - 1.20 mg/dL   Calcium 9.5 8.4 - 10.5 mg/dL   GFR 109.53 >60.00 mL/min  Hepatic function panel     Status: None   Collection Time: 06/21/17 10:49 AM  Result Value Ref Range   Total Bilirubin 0.3 0.2 - 1.2 mg/dL   Bilirubin, Direct 0.1 0.0 - 0.3 mg/dL   Alkaline Phosphatase 95 39 - 117 U/L   AST 17 0 - 37 U/L   ALT 23 0 - 35 U/L   Total Protein 6.7 6.0 - 8.3 g/dL   Albumin 4.3 3.5 - 5.2 g/dL  CBC with Differential/Platelet     Status: None   Collection Time: 06/21/17 10:49 AM  Result Value Ref Range   WBC 6.3 4.0 - 10.5 K/uL   RBC 4.79 3.87 - 5.11 Mil/uL   Hemoglobin 14.6 12.0 - 15.0 g/dL   HCT 43.8 36.0 - 46.0 %   MCV 91.5 78.0 - 100.0 fl   MCHC 33.3 30.0 - 36.0 g/dL   RDW 13.1 11.5 - 15.5 %   Platelets 318.0 150.0 - 400.0 K/uL   Neutrophils Relative % 64.7 43.0 - 77.0 %   Lymphocytes Relative 26.9 12.0 - 46.0 %   Monocytes Relative 6.1 3.0 - 12.0 %   Eosinophils Relative 1.3 0.0 - 5.0 %   Basophils Relative 1.0 0.0 - 3.0 %   Neutro Abs 4.1 1.4 - 7.7 K/uL   Lymphs Abs 1.7 0.7 - 4.0 K/uL   Monocytes Absolute 0.4 0.1 - 1.0 K/uL   Eosinophils Absolute 0.1 0.0 - 0.7 K/uL   Basophils Absolute 0.1 0.0 - 0.1 K/uL  VITAMIN D 25 Hydroxy (Vit-D Deficiency, Fractures)     Status: Abnormal   Collection Time: 06/21/17 10:49 AM  Result Value Ref Range   VITD 24.34 (L) 30.00 - 100.00 ng/mL  B12     Status: None   Collection Time: 06/21/17 10:49 AM  Result Value Ref Range   Vitamin B-12 537 211 - 911 pg/mL   Assessment/Plan: 1. Anxiety and depression Deteriorated from baseline but stable since visit with Dr. Birdie Riddle. Will increase Wellbutrin to 300 mg daily. Continue other  medications as directed. Will grant FMLA from today  through 1 week. FMLA for time prior to visit, will have to be at approval of PCP.   Leeanne Rio, PA-C

## 2017-07-03 NOTE — Progress Notes (Signed)
Pre visit review using our clinic review tool, if applicable. No additional management support is needed unless otherwise documented below in the visit note. 

## 2017-07-05 ENCOUNTER — Encounter: Payer: Self-pay | Admitting: Physician Assistant

## 2017-07-10 ENCOUNTER — Ambulatory Visit: Payer: Managed Care, Other (non HMO) | Admitting: Physician Assistant

## 2017-07-10 NOTE — Progress Notes (Deleted)
Patient presents to clinic today for follow-up of anxiety and depression. At last visit, patient encouraged to increase Wellbutrin XL to 300 mg daily. Was also instructed to start mindfulness practice. She was scheduled for follow-up for reassessment prior to RTW. Since last visit, patient endorses ***. ***. Denies ***. ***.  Past Medical History:  Diagnosis Date  . ADD (attention deficit disorder)   . GERD (gastroesophageal reflux disease)   . Thyroid disease     Current Outpatient Prescriptions on File Prior to Visit  Medication Sig Dispense Refill  . ALPRAZolam (XANAX) 0.5 MG tablet Take 1 tablet (0.5 mg total) by mouth 2 (two) times daily as needed for anxiety. 30 tablet 1  . buPROPion (WELLBUTRIN XL) 150 MG 24 hr tablet Take 1 tablet (150 mg total) by mouth daily. 30 tablet 3  . DULoxetine (CYMBALTA) 60 MG capsule TAKE 1 CAPSULE(60 MG) BY MOUTH DAILY 30 capsule 6  . fluticasone (FLONASE) 50 MCG/ACT nasal spray Place 2 sprays into both nostrils daily. 16 g 6  . levothyroxine (SYNTHROID, LEVOTHROID) 125 MCG tablet Take 125 mcg by mouth daily before breakfast.    . pantoprazole (PROTONIX) 40 MG tablet Take 1 tablet (40 mg total) by mouth daily. 30 tablet 3  . polyethylene glycol (MIRALAX / GLYCOLAX) packet Take 17 g by mouth daily. One capful 100 each 3  . Vitamin D, Ergocalciferol, (DRISDOL) 50000 units CAPS capsule Take 1 capsule (50,000 Units total) by mouth every 7 (seven) days. 12 capsule 0   No current facility-administered medications on file prior to visit.     Allergies  Allergen Reactions  . Penicillins Rash    Can tolerate Amoxicillin    Family History  Problem Relation Age of Onset  . Multiple sclerosis Mother   . Skin cancer Mother   . Breast cancer Maternal Grandmother   . Diabetes Maternal Grandmother   . Other Maternal Grandmother        Blood Cancer  . Irritable bowel syndrome Maternal Grandmother   . Colon polyps Maternal Grandmother   . High blood  pressure Maternal Grandfather   . Heart disease Maternal Grandfather   . Colon polyps Maternal Grandfather   . Heart attack Maternal Grandfather        in early 59s.   . Prostate cancer Maternal Uncle   . Celiac disease Daughter   . Colon cancer Maternal Uncle        questionable    Social History   Social History  . Marital status: Married    Spouse name: N/A  . Number of children: 2  . Years of education: N/A   Occupational History  . [employer]   Social History Main Topics  . Smoking status: Former Smoker    Packs/day: 0.25    Years: 15.00    Types: Cigarettes    Quit date: 04/14/2010  . Smokeless tobacco: Never Used  . Alcohol use 0.0 oz/week     Comment: Socially  . Drug use: No  . Sexual activity: Not on file   Other Topics Concern  . Not on file   Social History Narrative  . No narrative on file    Review of Systems - See HPI.  All other ROS are negative.  LMP 03/11/2014   Physical Exam  Recent Results (from the past 2160 hour(s))  Basic metabolic panel     Status: None   Collection Time: 06/21/17 10:49 AM  Result Value Ref Range  Sodium 140 135 - 145 mEq/L   Potassium 4.1 3.5 - 5.1 mEq/L   Chloride 104 96 - 112 mEq/L   CO2 31 19 - 32 mEq/L   Glucose, Bld 89 70 - 99 mg/dL   BUN 10 6 - 23 mg/dL   Creatinine, Ser 0.63 0.40 - 1.20 mg/dL   Calcium 9.5 8.4 - 10.5 mg/dL   GFR 109.53 >60.00 mL/min  Hepatic function panel     Status: None   Collection Time: 06/21/17 10:49 AM  Result Value Ref Range   Total Bilirubin 0.3 0.2 - 1.2 mg/dL   Bilirubin, Direct 0.1 0.0 - 0.3 mg/dL   Alkaline Phosphatase 95 39 - 117 U/L   AST 17 0 - 37 U/L   ALT 23 0 - 35 U/L   Total Protein 6.7 6.0 - 8.3 g/dL   Albumin 4.3 3.5 - 5.2 g/dL  CBC with Differential/Platelet     Status: None   Collection Time: 06/21/17 10:49 AM  Result Value Ref Range   WBC 6.3 4.0 - 10.5 K/uL   RBC 4.79 3.87 - 5.11 Mil/uL   Hemoglobin 14.6 12.0 - 15.0 g/dL   HCT  43.8 36.0 - 46.0 %   MCV 91.5 78.0 - 100.0 fl   MCHC 33.3 30.0 - 36.0 g/dL   RDW 13.1 11.5 - 15.5 %   Platelets 318.0 150.0 - 400.0 K/uL   Neutrophils Relative % 64.7 43.0 - 77.0 %   Lymphocytes Relative 26.9 12.0 - 46.0 %   Monocytes Relative 6.1 3.0 - 12.0 %   Eosinophils Relative 1.3 0.0 - 5.0 %   Basophils Relative 1.0 0.0 - 3.0 %   Neutro Abs 4.1 1.4 - 7.7 K/uL   Lymphs Abs 1.7 0.7 - 4.0 K/uL   Monocytes Absolute 0.4 0.1 - 1.0 K/uL   Eosinophils Absolute 0.1 0.0 - 0.7 K/uL   Basophils Absolute 0.1 0.0 - 0.1 K/uL  VITAMIN D 25 Hydroxy (Vit-D Deficiency, Fractures)     Status: Abnormal   Collection Time: 06/21/17 10:49 AM  Result Value Ref Range   VITD 24.34 (L) 30.00 - 100.00 ng/mL  B12     Status: None   Collection Time: 06/21/17 10:49 AM  Result Value Ref Range   Vitamin B-12 537 211 - 911 pg/mL    Assessment/Plan: No problem-specific Assessment & Plan notes found for this encounter.    Leeanne Rio, PA-C

## 2017-07-12 ENCOUNTER — Ambulatory Visit: Payer: Managed Care, Other (non HMO) | Admitting: Family Medicine

## 2017-07-12 DIAGNOSIS — Z0289 Encounter for other administrative examinations: Secondary | ICD-10-CM

## 2017-07-21 ENCOUNTER — Ambulatory Visit: Payer: Managed Care, Other (non HMO) | Admitting: Family Medicine

## 2017-07-21 DIAGNOSIS — Z0289 Encounter for other administrative examinations: Secondary | ICD-10-CM

## 2018-02-20 ENCOUNTER — Other Ambulatory Visit: Payer: Self-pay | Admitting: Family Medicine

## 2018-02-21 NOTE — Telephone Encounter (Signed)
Last OV 07/03/17 Alprazolam last filled 06/21/17 #30 with 1

## 2018-03-21 ENCOUNTER — Other Ambulatory Visit: Payer: Self-pay

## 2018-03-21 ENCOUNTER — Encounter: Payer: Self-pay | Admitting: Physician Assistant

## 2018-03-21 ENCOUNTER — Ambulatory Visit (INDEPENDENT_AMBULATORY_CARE_PROVIDER_SITE_OTHER): Payer: 59 | Admitting: Physician Assistant

## 2018-03-21 VITALS — BP 110/80 | HR 79 | Temp 98.1°F | Resp 14 | Ht 65.0 in | Wt 178.0 lb

## 2018-03-21 DIAGNOSIS — J358 Other chronic diseases of tonsils and adenoids: Secondary | ICD-10-CM

## 2018-03-21 DIAGNOSIS — R0982 Postnasal drip: Secondary | ICD-10-CM

## 2018-03-21 DIAGNOSIS — J029 Acute pharyngitis, unspecified: Secondary | ICD-10-CM | POA: Diagnosis not present

## 2018-03-21 LAB — POCT RAPID STREP A (OFFICE): Rapid Strep A Screen: NEGATIVE

## 2018-03-21 MED ORDER — AZELASTINE HCL 0.1 % NA SOLN: 2.0000 | Freq: Two times a day (BID) | NASAL | 1 refills | Status: DC

## 2018-03-21 NOTE — Patient Instructions (Signed)
Please keep well-hydrated.  Do a daily nasal rinse with saline.  Stop the Flonase. Use the Astelin as directed to see if this helps with drainage. A daily Claritin or Zyrtec will also be beneficial.  Giving stones and recurrent issues, I am setting you up with ENT for further management.

## 2018-03-21 NOTE — Progress Notes (Signed)
Patient presents to clinic today c/o intermittent bilateral sore throat with tonsillar swelling. Notes she has been getting tonsil stones over the past couple of years. These do cause swelling and irritation. Patient also with seasonal allergies causing constant PND. Is taking antihistamine on occasion. Patient denies other URI symptoms. Denies fever, chills, body aches. Denies recent travel or sick contact.   Past Medical History:  Diagnosis Date  . ADD (attention deficit disorder)   . GERD (gastroesophageal reflux disease)   . Thyroid disease     Current Outpatient Medications on File Prior to Visit  Medication Sig Dispense Refill  . ALPRAZolam (XANAX) 0.5 MG tablet TAKE 1 TABLET BY MOUTH TWICE DAILY AS NEEDED FOR ANXIETY 30 tablet 0  . fluticasone (FLONASE) 50 MCG/ACT nasal spray Place 2 sprays into both nostrils daily. 16 g 6  . levothyroxine (SYNTHROID, LEVOTHROID) 125 MCG tablet Take 125 mcg by mouth daily before breakfast.    . pantoprazole (PROTONIX) 40 MG tablet Take 1 tablet (40 mg total) by mouth daily. 30 tablet 3  . polyethylene glycol (MIRALAX / GLYCOLAX) packet Take 17 g by mouth daily. One capful 100 each 3   No current facility-administered medications on file prior to visit.     Allergies  Allergen Reactions  . Penicillins Rash    Can tolerate Amoxicillin    Family History  Problem Relation Age of Onset  . Multiple sclerosis Mother   . Skin cancer Mother   . Breast cancer Maternal Grandmother   . Diabetes Maternal Grandmother   . Other Maternal Grandmother        Blood Cancer  . Irritable bowel syndrome Maternal Grandmother   . Colon polyps Maternal Grandmother   . High blood pressure Maternal Grandfather   . Heart disease Maternal Grandfather   . Colon polyps Maternal Grandfather   . Heart attack Maternal Grandfather        in early 37s.   . Prostate cancer Maternal Uncle   . Celiac disease Daughter   . Colon cancer Maternal Uncle        questionable     Social History   Socioeconomic History  . Marital status: Married    Spouse name: Not on file  . Number of children: 2  . Years of education: Not on file  . Highest education level: Not on file  Occupational History  . Occupation: Research officer, political party: [employer]  . Financial resource strain: Not on file  . Food insecurity:    Worry: Not on file    Inability: Not on file  . Transportation needs:    Medical: Not on file    Non-medical: Not on file  Tobacco Use  . Smoking status: Former Smoker    Packs/day: 0.25    Years: 15.00    Pack years: 3.75    Types: Cigarettes    Last attempt to quit: 04/14/2010    Years since quitting: 7.9  . Smokeless tobacco: Never Used  Substance and Sexual Activity  . Alcohol use: Yes    Alcohol/week: 0.0 oz    Comment: Socially  . Drug use: No  . Sexual activity: Not on file  Lifestyle  . Physical activity:    Days per week: Not on file    Minutes per session: Not on file  . Stress: Not on file  Relationships  . Social connections:    Talks on phone: Not on file    Gets  together: Not on file    Attends religious service: Not on file    Active member of club or organization: Not on file    Attends meetings of clubs or organizations: Not on file    Relationship status: Not on file  Other Topics Concern  . Not on file  Social History Narrative  . Not on file   Review of Systems - See HPI.  All other ROS are negative.  BP 110/80   Pulse 79   Temp 98.1 F (36.7 C) (Oral)   Resp 14   Ht 5\' 5"  (1.651 m)   Wt 178 lb (80.7 kg)   LMP 03/11/2014   SpO2 99%   BMI 29.62 kg/m   Physical Exam  Constitutional: She appears well-developed and well-nourished.  Non-toxic appearance.  HENT:  Head: Normocephalic and atraumatic.  Right Ear: Tympanic membrane normal.  Mouth/Throat: Uvula is midline and mucous membranes are normal. Posterior oropharyngeal erythema (mild) present. No oropharyngeal exudate,  posterior oropharyngeal edema or tonsillar abscesses.  Left-sided tonsillith noted.   Eyes: EOM are normal.  Neck: Neck supple.  Cardiovascular: Normal rate and regular rhythm.  Skin: No rash noted.  Psychiatric: She has a normal mood and affect.   Assessment/Plan: 1. Sore throat Recurrent. Negative strep test. Suspect 2/2 #2 and 3. See treatment below. - POCT rapid strep A - Ambulatory referral to ENT  2. Tonsillith Noted on L tonsil. No current infection but has history of such. Will refer to ENT for further assessment.  - Ambulatory referral to ENT  3. Post-nasal drip Start saline nasal rinses. Flonase not beneficial. Stop this. Rx given for Astelin nasal spray.  - Ambulatory referral to ENT   Leeanne Rio, PA-C

## 2018-04-04 DIAGNOSIS — R87612 Low grade squamous intraepithelial lesion on cytologic smear of cervix (LGSIL): Secondary | ICD-10-CM | POA: Insufficient documentation

## 2018-11-02 ENCOUNTER — Other Ambulatory Visit: Payer: Self-pay

## 2018-11-02 ENCOUNTER — Ambulatory Visit: Payer: 59 | Admitting: Physician Assistant

## 2018-11-02 ENCOUNTER — Encounter: Payer: Self-pay | Admitting: Physician Assistant

## 2018-11-02 VITALS — BP 108/78 | HR 88 | Temp 98.1°F | Resp 14 | Ht 65.0 in | Wt 162.0 lb

## 2018-11-02 DIAGNOSIS — B029 Zoster without complications: Secondary | ICD-10-CM

## 2018-11-02 MED ORDER — TRAMADOL HCL 50 MG PO TABS: 50.0000 mg | ORAL_TABLET | Freq: Three times a day (TID) | ORAL | 0 refills | Status: DC | PRN

## 2018-11-02 MED ORDER — VALACYCLOVIR HCL 1 G PO TABS: 1000.0000 mg | ORAL_TABLET | Freq: Three times a day (TID) | ORAL | 0 refills | Status: DC

## 2018-11-02 NOTE — Progress Notes (Signed)
Patient presents to clinic today c/o back in upper back, under arm and R-sided neck associated with a rash that is burning and stinging. Notes skin is very sensitive. Was associated with a bad headache yesterday. Pt denies fever, nausea, vomiting, diarrhea, denies any face involvement or ocular involvement. Endorses some chills. Denies prior episode.  Past Medical History:  Diagnosis Date  . ADD (attention deficit disorder)   . GERD (gastroesophageal reflux disease)   . Thyroid disease     Current Outpatient Medications on File Prior to Visit  Medication Sig Dispense Refill  . levothyroxine (SYNTHROID, LEVOTHROID) 125 MCG tablet Take 125 mcg by mouth daily before breakfast.    . pantoprazole (PROTONIX) 40 MG tablet Take 1 tablet (40 mg total) by mouth daily. 30 tablet 3   No current facility-administered medications on file prior to visit.     Allergies  Allergen Reactions  . Penicillins Rash    Can tolerate Amoxicillin    Family History  Problem Relation Age of Onset  . Multiple sclerosis Mother   . Skin cancer Mother   . Breast cancer Maternal Grandmother   . Diabetes Maternal Grandmother   . Other Maternal Grandmother        Blood Cancer  . Irritable bowel syndrome Maternal Grandmother   . Colon polyps Maternal Grandmother   . High blood pressure Maternal Grandfather   . Heart disease Maternal Grandfather   . Colon polyps Maternal Grandfather   . Heart attack Maternal Grandfather        in early 73s.   . Prostate cancer Maternal Uncle   . Celiac disease Daughter   . Colon cancer Maternal Uncle        questionable    Social History   Socioeconomic History  . Marital status: Married    Spouse name: Not on file  . Number of children: 2  . Years of education: Not on file  . Highest education level: Not on file  Occupational History  . Occupation: Research officer, political party: [employer]  . Financial resource strain: Not on file  . Food  insecurity:    Worry: Not on file    Inability: Not on file  . Transportation needs:    Medical: Not on file    Non-medical: Not on file  Tobacco Use  . Smoking status: Former Smoker    Packs/day: 0.25    Years: 15.00    Pack years: 3.75    Types: Cigarettes    Last attempt to quit: 04/14/2010    Years since quitting: 8.5  . Smokeless tobacco: Never Used  Substance and Sexual Activity  . Alcohol use: Yes    Alcohol/week: 0.0 standard drinks    Comment: Socially  . Drug use: No  . Sexual activity: Not on file  Lifestyle  . Physical activity:    Days per week: Not on file    Minutes per session: Not on file  . Stress: Not on file  Relationships  . Social connections:    Talks on phone: Not on file    Gets together: Not on file    Attends religious service: Not on file    Active member of club or organization: Not on file    Attends meetings of clubs or organizations: Not on file    Relationship status: Not on file  Other Topics Concern  . Not on file  Social History Narrative  . Not on file  Review of Systems - See HPI.  All other ROS are negative.  LMP 03/11/2014   Physical Exam HENT:     Head: Normocephalic.  Neck:     Meningeal: Brudzinski's sign and Kernig's sign absent.  Cardiovascular:     Rate and Rhythm: Normal rate and regular rhythm.     Heart sounds: Normal heart sounds.  Pulmonary:     Effort: Pulmonary effort is normal.     Breath sounds: Normal breath sounds.  Skin:    Findings: Rash present. Rash is papular and vesicular.          Comments: Rash extends along right  lateral axillary region and right chest   Neurological:     Mental Status: She is alert.    Assessment/Plan: 1. Herpes zoster without complication Start Valtrex TID x 7 days. Tramadol for more severe pain. OTC medications and supportive measures reviewed. - valACYclovir (VALTREX) 1000 MG tablet; Take 1 tablet (1,000 mg total) by mouth 3 (three) times daily.  Dispense: 21  tablet; Refill: 0 - traMADol (ULTRAM) 50 MG tablet; Take 1 tablet (50 mg total) by mouth every 8 (eight) hours as needed.  Dispense: 10 tablet; Refill: 0   Leeanne Rio, PA-C

## 2018-11-02 NOTE — Patient Instructions (Addendum)
Please keep the rash covered. Apply topical lidocaine to help calm down the nerves if you are able. Tylenol for mild pain. The Tramadol is for more moderate to severe pain if needed. Take the Valtrex as directed.  You are no longer contagious once the blisters have scabbed over.    Shingles  Shingles is an infection. It gives you a painful skin rash and blisters that have fluid in them. Shingles is caused by the same germ (virus) that causes chickenpox. Shingles only happens in people who:  Have had chickenpox.  Have been given a shot of medicine (vaccine) to protect against chickenpox. Shingles is rare in this group. The first symptoms of shingles may be itching, tingling, or pain in an area on your skin. A rash will show on your skin a few days or weeks later. The rash is likely to be on one side of your body. The rash usually has a shape like a belt or a band. Over time, the rash turns into fluid-filled blisters. The blisters will break open, change into scabs, and dry up. Medicines may:  Help with pain and itching.  Help you get better sooner.  Help to prevent long-term problems. Follow these instructions at home: Medicines  Take over-the-counter and prescription medicines only as told by your doctor.  Put on an anti-itch cream or numbing cream where you have a rash, blisters, or scabs. Do this as told by your doctor. Helping with itching and discomfort   Put cold, wet cloths (cold compresses) on the area of the rash or blisters as told by your doctor.  Cool baths can help you feel better. Try adding baking soda or dry oatmeal to the water to lessen itching. Do not bathe in hot water. Blister and rash care  Keep your rash covered with a loose bandage (dressing).  Wear loose clothing that does not rub on your rash.  Keep your rash and blisters clean. To do this, wash the area with mild soap and cool water as told by your doctor.  Check your rash every day for signs of  infection. Check for: ? More redness, swelling, or pain. ? Fluid or blood. ? Warmth. ? Pus or a bad smell.  Do not scratch your rash. Do not pick at your blisters. To help you to not scratch: ? Keep your fingernails clean and cut short. ? Wear gloves or mittens when you sleep, if scratching is a problem. General instructions  Rest as told by your doctor.  Keep all follow-up visits as told by your doctor. This is important.  Wash your hands often with soap and water. If soap and water are not available, use hand sanitizer. Doing this lowers your chance of getting a skin infection caused by germs (bacteria).  Your infection can cause chickenpox in people who have never had chickenpox or never got a shot of chickenpox vaccine. If you have blisters that did not change into scabs yet, try not to touch other people or be around other people, especially: ? Babies. ? Pregnant women. ? Children who have areas of red, itchy, or rough skin (eczema). ? Very old people who have transplants. ? People who have a long-term (chronic) sickness, like cancer or AIDS. Contact a doctor if:  Your pain does not get better with medicine.  Your pain does not get better after the rash heals.  You have any signs of infection in the rash area. These signs include: ? More redness, swelling, or pain around  the rash. ? Fluid or blood coming from the rash. ? The rash area feeling warm to the touch. ? Pus or a bad smell coming from the rash. Get help right away if:  The rash is on your face or nose.  You have pain in your face or pain by your eye.  You lose feeling on one side of your face.  You have trouble seeing.  You have ear pain, or you have ringing in your ear.  You have a loss of taste.  Your condition gets worse. Summary  Shingles gives you a painful skin rash and blisters that have fluid in them.  Shingles is an infection. It is caused by the same germ (virus) that causes  chickenpox.  Keep your rash covered with a loose bandage (dressing). Wear loose clothing that does not rub on your rash.  If you have blisters that did not change into scabs yet, try not to touch other people or be around people. This information is not intended to replace advice given to you by your health care provider. Make sure you discuss any questions you have with your health care provider. Document Released: 02/29/2008 Document Revised: 05/17/2017 Document Reviewed: 05/17/2017 Elsevier Interactive Patient Education  2019 [employer].

## 2018-11-19 ENCOUNTER — Telehealth: Payer: Self-pay | Admitting: Family Medicine

## 2018-11-19 NOTE — Telephone Encounter (Unsigned)
Copied from [place] [id number]. Topic: Quick Communication - Rx Refill/Question >> Nov 19, 2018  2:46 PM Scherrie Gerlach wrote: Medication: pantoprazole (PROTONIX) 40 MG tablet  Pt wanting this rx filled.  However, not been filled since 2017.  Pt declined to make an appt, stating she prefers I ask the dr first.  Pt not seen Dr Birdie Riddle since 06/21/2017.  But has seen [facility] for acute issue.  [place], [place] [phone] (Phone) [phone] (Fax)

## 2018-11-19 NOTE — Telephone Encounter (Signed)
Last refill:03/10/16 #30, 3 Last OV:01/25/17

## 2018-11-20 ENCOUNTER — Encounter: Payer: Self-pay | Admitting: General Practice

## 2018-11-20 NOTE — Telephone Encounter (Signed)
Called pt to inform, message states that the [place] caller has restrictions that prevents this call from being completed at this time.   Per PCP pt needs an appt before she can have any further refills.   [place] for [facility] to Discuss results / PCP recommendations / Schedule patient.

## 2018-11-20 NOTE — Telephone Encounter (Signed)
Pt has not had a CPE since 2017.  Pt needs to schedule an appt prior to prescription being filled.  In the meantime, she can start an OTC Omeprazole or Nexium

## 2019-02-26 ENCOUNTER — Telehealth: Payer: Self-pay | Admitting: *Deleted

## 2019-02-26 NOTE — Telephone Encounter (Signed)
LM for patient to call. She is due for CPE -calling to see if she is willing to come in for appointment.

## 2020-11-16 ENCOUNTER — Other Ambulatory Visit: Payer: Self-pay | Admitting: Obstetrics & Gynecology

## 2020-11-16 DIAGNOSIS — Z1231 Encounter for screening mammogram for malignant neoplasm of breast: Secondary | ICD-10-CM

## 2021-01-05 ENCOUNTER — Inpatient Hospital Stay: Admission: RE | Admit: 2021-01-05 | Payer: 59 | Source: Ambulatory Visit

## 2021-02-24 ENCOUNTER — Other Ambulatory Visit: Payer: Self-pay

## 2021-02-24 ENCOUNTER — Ambulatory Visit
Admission: RE | Admit: 2021-02-24 | Discharge: 2021-02-24 | Disposition: A | Discharge status: Home / Self Care | Payer: BC Managed Care – PPO | Source: Ambulatory Visit | Attending: Obstetrics & Gynecology | Admitting: Obstetrics & Gynecology

## 2021-02-24 DIAGNOSIS — Z1231 Encounter for screening mammogram for malignant neoplasm of breast: Secondary | ICD-10-CM

## 2021-03-01 ENCOUNTER — Other Ambulatory Visit: Payer: Self-pay | Admitting: Obstetrics & Gynecology

## 2021-03-01 DIAGNOSIS — R928 Other abnormal and inconclusive findings on diagnostic imaging of breast: Secondary | ICD-10-CM

## 2021-03-22 ENCOUNTER — Ambulatory Visit
Admission: RE | Admit: 2021-03-22 | Discharge: 2021-03-22 | Disposition: A | Discharge status: Home / Self Care | Payer: BC Managed Care – PPO | Source: Ambulatory Visit | Attending: Obstetrics & Gynecology | Admitting: Obstetrics & Gynecology

## 2021-03-22 ENCOUNTER — Other Ambulatory Visit: Payer: Self-pay | Admitting: Obstetrics & Gynecology

## 2021-03-22 ENCOUNTER — Other Ambulatory Visit: Payer: Self-pay

## 2021-03-22 DIAGNOSIS — R928 Other abnormal and inconclusive findings on diagnostic imaging of breast: Secondary | ICD-10-CM

## 2021-03-22 DIAGNOSIS — N6320 Unspecified lump in the left breast, unspecified quadrant: Secondary | ICD-10-CM

## 2021-03-24 ENCOUNTER — Ambulatory Visit
Admission: RE | Admit: 2021-03-24 | Discharge: 2021-03-24 | Disposition: A | Discharge status: Home / Self Care | Payer: BC Managed Care – PPO | Source: Ambulatory Visit | Attending: Obstetrics & Gynecology | Admitting: Obstetrics & Gynecology

## 2021-03-24 ENCOUNTER — Other Ambulatory Visit: Payer: Self-pay

## 2021-03-24 ENCOUNTER — Encounter: Payer: Self-pay | Admitting: *Deleted

## 2021-03-24 DIAGNOSIS — N6320 Unspecified lump in the left breast, unspecified quadrant: Secondary | ICD-10-CM

## 2021-03-30 ENCOUNTER — Other Ambulatory Visit: Payer: BC Managed Care – PPO

## 2022-03-16 ENCOUNTER — Other Ambulatory Visit: Payer: Self-pay | Admitting: Obstetrics & Gynecology

## 2022-03-16 DIAGNOSIS — N6310 Unspecified lump in the right breast, unspecified quadrant: Secondary | ICD-10-CM

## 2022-03-28 ENCOUNTER — Ambulatory Visit
Admission: RE | Admit: 2022-03-28 | Discharge: 2022-03-28 | Disposition: A | Discharge status: Home / Self Care | Payer: Managed Care, Other (non HMO) | Source: Ambulatory Visit | Attending: Obstetrics & Gynecology | Admitting: Obstetrics & Gynecology

## 2022-03-28 ENCOUNTER — Ambulatory Visit
Admission: RE | Admit: 2022-03-28 | Discharge: 2022-03-28 | Disposition: A | Discharge status: Home / Self Care | Payer: BC Managed Care – PPO | Source: Ambulatory Visit | Attending: Obstetrics & Gynecology | Admitting: Obstetrics & Gynecology

## 2022-03-28 DIAGNOSIS — N6310 Unspecified lump in the right breast, unspecified quadrant: Secondary | ICD-10-CM

## 2022-03-30 ENCOUNTER — Other Ambulatory Visit: Payer: BC Managed Care – PPO

## 2023-04-24 ENCOUNTER — Other Ambulatory Visit: Payer: Self-pay | Admitting: Obstetrics & Gynecology

## 2023-04-24 DIAGNOSIS — N63 Unspecified lump in unspecified breast: Secondary | ICD-10-CM

## 2023-05-08 NOTE — Progress Notes (Unsigned)
Subjective:   Nicole Perry [birth year]  05/09/2023   CC: No chief complaint on file.   HPI: Nicole Perry is a 47 y.o. female who presents for a routine health maintenance exam.  Labs collected at time of visit.    HEALTH SCREENINGS: - Vision Screening: {Blank single:19197::"pap done","not applicable","up to date","done elsewhere"} - Dental Visits: {Blank single:19197::"pap done","not applicable","up to date","done elsewhere"} - Pap smear: {Blank single:19197::"pap done","not applicable","up to date","done elsewhere"} - Breast Exam: {Blank single:19197::"pap done","not applicable","up to date","done elsewhere"} - STD Screening: {Blank single:19197::"Up to date","Ordered today","Not applicable","Declined","Done elsewhere"} - Mammogram (40+): {Blank single:19197::"Up to date","Ordered today","Not applicable","Refused","Done elsewhere"}  - Colonoscopy (45+): {Blank single:19197::"Up to date","Ordered today","Not applicable","Refused","Done elsewhere"}  - Bone Density (65+ or under 65 with predisposing conditions): {Blank single:19197::"Up to date","Ordered today","Not applicable","Refused","Done elsewhere"}  - Lung CA screening with low-dose CT:  {Blank single:19197::"Up to date","Ordered today","Not applicable","Declined","Done elsewhere"} Adults age 32-80 who are current cigarette smokers or quit within the last 15 years. Must have 20 pack year history.   Depression and Anxiety Screen done today and results listed below:     03/21/2018    8:13 AM 06/21/2017    9:53 AM 03/10/2016    3:37 PM 10/30/2015    2:10 PM 02/11/2015    9:00 AM  Depression screen PHQ 2/9  Decreased Interest 0 2 0 1 1  Down, Depressed, Hopeless 0 1 1 1 1   PHQ - 2 Score 0 3 1 2 2   Altered sleeping 0 2  1 1   Tired, decreased energy 0 2  1 1   Change in appetite 0 0  1 1  Feeling bad or failure about yourself  0 0  1 0  Trouble concentrating 0 0  1 2  Moving slowly or fidgety/restless 0 0  0 0   Suicidal thoughts 0 0  0 0  PHQ-9 Score 0 7  7 7   Difficult doing work/chores Not difficult at all Not difficult at all  Somewhat difficult Somewhat difficult       No data to display          IMMUNIZATIONS: - Tdap: Tetanus vaccination status reviewed: {tetanus status:315746}. - HPV: {Blank single:19197::"Up to date","Administered today","Not applicable","Refused","Given elsewhere"} - Influenza: {Blank single:19197::"Up to date","Administered today","Postponed to flu season","Refused","Given elsewhere"} - Pneumovax: {Blank single:19197::"Up to date","Administered today","Not applicable","Refused","Given elsewhere"} - Prevnar 20: {Blank single:19197::"Up to date","Administered today","Not applicable","Refused","Given elsewhere"} - Zostavax (50+): {Blank single:19197::"Up to date","Administered today","Not applicable","Refused","Given elsewhere"}   Past medical history, surgical history, medications, allergies, family history and social history reviewed with patient today and changes made to appropriate areas of the chart.   Past Medical History:  Diagnosis Date   ADD (attention deficit disorder)    GERD (gastroesophageal reflux disease)    Thyroid disease     Past Surgical History:  Procedure Laterality Date   ABDOMINAL HYSTERECTOMY     APPENDECTOMY  1989   LAPAROSCOPIC ASSISTED VAGINAL HYSTERECTOMY N/A 04/14/2014   Procedure: LAPAROSCOPIC ASSISTED VAGINAL HYSTERECTOMY;  Surgeon: Mitchel Honour, DO;  Location: [facility];  Service: Gynecology;  Laterality: N/A;   SALPINGOOPHORECTOMY Bilateral 04/14/2014   Procedure: Bilateral salpingectomy ;  Surgeon: Mitchel Honour, DO;  Location: [facility];  Service: Gynecology;  Laterality: Bilateral;    Current Outpatient Medications on File Prior to Visit  Medication Sig   levothyroxine (SYNTHROID, LEVOTHROID) 125 MCG tablet Take 125 mcg by mouth daily before breakfast.   pantoprazole (PROTONIX) 40 MG tablet Take 1 tablet (40 mg total) by mouth  daily.  traMADol (ULTRAM) 50 MG tablet Take 1 tablet (50 mg total) by mouth every 8 (eight) hours as needed.   valACYclovir (VALTREX) 1000 MG tablet Take 1 tablet (1,000 mg total) by mouth 3 (three) times daily.   No current facility-administered medications on file prior to visit.    Allergies  Allergen Reactions   Penicillins Rash    Can tolerate Amoxicillin     Social History   Socioeconomic History   Marital status: Married    Spouse name: Not on file   Number of children: 2   Years of education: Not on file   Highest education level: Not on file  Occupational History   Occupation: Inventory Control    Employer: [employer]  Tobacco Use   Smoking status: Former    Current packs/day: 0.00    Average packs/day: 0.3 packs/day for 15.0 years (3.8 ttl pk-yrs)    Types: Cigarettes    Start date: 04/15/1995    Quit date: 04/14/2010    Years since quitting: 13.0   Smokeless tobacco: Never  Vaping Use   Vaping status: Never Used  Substance and Sexual Activity   Alcohol use: Yes    Alcohol/week: 0.0 standard drinks of alcohol    Comment: Socially   Drug use: No   Sexual activity: Not on file  Other Topics Concern   Not on file  Social History Narrative   Not on file   Social Determinants of Health   Financial Resource Strain: Not on file  Food Insecurity: No Food Insecurity (04/08/2022)   [facility]   Hunger Vital Sign    Worried About Running Out of Food in the Last Year: Never true    Ran Out of Food in the Last Year: Never true  Transportation Needs: Not on file  Physical Activity: Not on file  Stress: Not on file  Social Connections: Unknown (02/05/2022)   [facility] from [facility], [facility]   Social Network    Social Network: Not on file  Intimate Partner Violence: Unknown (12/29/2021)   Received from [facility], [facility]   HITS    Physically Hurt: Not on file    Insult or Talk Down To: Not on file    Threaten Physical  Harm: Not on file    Scream or Curse: Not on file   Social History   Tobacco Use  Smoking Status Former   Current packs/day: 0.00   Average packs/day: 0.3 packs/day for 15.0 years (3.8 ttl pk-yrs)   Types: Cigarettes   Start date: 04/15/1995   Quit date: 04/14/2010   Years since quitting: 13.0  Smokeless Tobacco Never   Social History   Substance and Sexual Activity  Alcohol Use Yes   Alcohol/week: 0.0 standard drinks of alcohol   Comment: Socially    Family History  Problem Relation Age of Onset   Multiple sclerosis Mother    Skin cancer Mother    Breast cancer Maternal Grandmother    Diabetes Maternal Grandmother    Other Maternal Grandmother        Blood Cancer   Irritable bowel syndrome Maternal Grandmother    Colon polyps Maternal Grandmother    High blood pressure Maternal Grandfather    Heart disease Maternal Grandfather    Colon polyps Maternal Grandfather    Heart attack Maternal Grandfather        in early 40s.    Prostate cancer Maternal Uncle    Celiac disease Daughter  Colon cancer Maternal Uncle        questionable     ROS: Denies fever, fatigue, unexplained weight loss/gain, chest pain, SHOB, and palpitations. Denies neurological deficits, gastrointestinal or genitourinary complaints, and skin changes.   Objective:   There were no vitals filed for this visit.  GENERAL APPEARANCE: Well-appearing, in NAD. Well nourished.  SKIN: Pink, warm and dry. Turgor normal. No rash, lesion, ulceration, or ecchymoses. Hair evenly distributed.  HEENT: HEAD: Normocephalic.  EYES: PERRLA. EOMI. Lids intact w/o defect. Sclera white, Conjunctiva pink w/o exudate.  EARS: External ear w/o redness, swelling, masses or lesions. EAC clear. TM's intact, translucent w/o bulging, appropriate landmarks visualized. Appropriate acuity to conversational tones.  NOSE: Septum midline w/o deformity. Nares patent, mucosa pink and non-inflamed w/o drainage. No sinus tenderness.   THROAT: Uvula midline. Oropharynx clear. Tonsils non-inflamed w/o exudate. Oral mucosa pink and moist.  NECK: Supple, Trachea midline. Full ROM w/o pain or tenderness. No lymphadenopathy. Thyroid non-tender w/o enlargement or palpable masses.  BREASTS: Breasts pendulous, symmetrical, and w/o palpable masses. Nipples everted and w/o discharge. No rash or skin retraction. No axillary or supraclavicular lymphadenopathy.  RESPIRATORY: Chest wall symmetrical w/o masses. Respirations even and non-labored. Breath sounds clear to auscultation bilaterally. No wheezes, rales, rhonchi, or crackles. CARDIAC: S1, S2 present, regular rate and rhythm. No gallops, murmurs, rubs, or clicks. PMI w/o lifts, heaves, or thrills. No carotid bruits. Capillary refill <2 seconds. Peripheral pulses 2+ bilaterally. GI: Abdomen soft w/o distention. Normoactive bowel sounds. No palpable masses or tenderness. No guarding or rebound tenderness. Liver and spleen w/o tenderness or enlargement. No CVA tenderness.  GU: External genitalia without erythema, lesions, or masses. No lymphadenopathy. Vaginal mucosa pink and moist without exudate, lesions, or ulcerations. Cervix pink without discharge. Cervical os closed. Uterus and adnexae palpable, not enlarged, and w/o tenderness. No palpable masses.  MSK: Muscle tone and strength appropriate for age, w/o atrophy or abnormal movement.  EXTREMITIES: Active ROM intact, w/o tenderness, crepitus, or contracture. No obvious joint deformities or effusions. No clubbing, edema, or cyanosis.  NEUROLOGIC: CN's II-XII intact. Motor strength symmetrical with no obvious weakness. No sensory deficits. DTR's 2+ symmetric bilaterally. Steady, even gait.  PSYCH/MENTAL STATUS: Alert, oriented x 3. Cooperative, appropriate mood and affect.   Chaperoned by Cristy Hilts, CMA   Results for orders placed or performed in visit on 03/21/18  POCT rapid strep A  Result Value Ref Range   Rapid Strep A Screen  Negative Negative    Assessment & Plan:  ***  No orders of the defined types were placed in this encounter.   PATIENT COUNSELING:  - Encouraged a healthy well-balanced diet. Patient may adjust caloric intake to maintain or achieve ideal body weight. May reduce intake of dietary saturated fat and total fat and have adequate dietary potassium and calcium preferably from fresh fruits, vegetables, and low-fat dairy products.   - Advised to avoid cigarette smoking. - Discussed with the patient that most people either abstain from alcohol or drink within safe limits (<=14/week and <=4 drinks/occasion for males, <=7/weeks and <= 3 drinks/occasion for females) and that the risk for alcohol disorders and other health effects rises proportionally with the number of drinks per week and how often a drinker exceeds daily limits. - Discussed cessation/primary prevention of drug use and availability of treatment for abuse.  - Discussed sexually transmitted diseases, avoidance of unintended pregnancy and contraceptive alternatives.  - Stressed the importance of regular exercise - Injury prevention: Discussed safety belts,  safety helmets, smoke detector, smoking near bedding or upholstery.  - Dental health: Discussed importance of regular tooth brushing, flossing, and dental visits.   NEXT PREVENTATIVE PHYSICAL DUE IN 1 YEAR.  No follow-ups on file.  Patient to reach out to office if new, worrisome, or unresolved symptoms arise or if no improvement in patient's condition. Patient verbalized understanding and is agreeable to treatment plan. All questions answered to patient's satisfaction.    Yolanda Manges, FNP

## 2023-05-09 ENCOUNTER — Telehealth: Payer: Self-pay

## 2023-05-09 ENCOUNTER — Encounter (HOSPITAL_BASED_OUTPATIENT_CLINIC_OR_DEPARTMENT_OTHER): Payer: Self-pay | Admitting: Family Medicine

## 2023-05-09 ENCOUNTER — Ambulatory Visit (INDEPENDENT_AMBULATORY_CARE_PROVIDER_SITE_OTHER): Payer: Managed Care, Other (non HMO) | Admitting: Family Medicine

## 2023-05-09 VITALS — BP 114/80 | HR 72 | Ht 65.0 in | Wt 170.1 lb

## 2023-05-09 DIAGNOSIS — Z0000 Encounter for general adult medical examination without abnormal findings: Secondary | ICD-10-CM

## 2023-05-09 DIAGNOSIS — Z1211 Encounter for screening for malignant neoplasm of colon: Secondary | ICD-10-CM

## 2023-05-09 DIAGNOSIS — E559 Vitamin D deficiency, unspecified: Secondary | ICD-10-CM | POA: Insufficient documentation

## 2023-05-09 DIAGNOSIS — E039 Hypothyroidism, unspecified: Secondary | ICD-10-CM | POA: Diagnosis not present

## 2023-05-09 MED ORDER — LEVOTHYROXINE SODIUM 137 MCG PO TABS: 137.0000 ug | ORAL_TABLET | Freq: Every day | ORAL | 3 refills | Status: DC

## 2023-05-09 MED ORDER — OMEPRAZOLE 20 MG PO CPDR: 20.0000 mg | DELAYED_RELEASE_CAPSULE | Freq: Every day | ORAL | 11 refills | Status: DC

## 2023-05-09 NOTE — Patient Instructions (Signed)

## 2023-05-09 NOTE — Telephone Encounter (Signed)
Gastroenterology Pre-Procedure Review  Request Date: TBD  Requesting Physician: Dr. Jodelle Gross  PATIENT REVIEW QUESTIONS: The patient responded to the following health history questions as indicated:    1. Are you having any GI issues? no 2. Do you have a personal history of Polyps? no 3. Do you have a family history of Colon Cancer or Polyps? no 4. Diabetes Mellitus? no 5. Joint replacements in the past 12 months?no 6. Major health problems in the past 3 months?no 7. Any artificial heart valves, MVP, or defibrillator?no    MEDICATIONS & ALLERGIES:    Patient reports the following regarding taking any anticoagulation/antiplatelet therapy:   Plavix, Coumadin, Eliquis, Xarelto, Lovenox, Pradaxa, Brilinta, or Effient? no Aspirin? no  Patient confirms/reports the following medications:  Current Outpatient Medications  Medication Sig Dispense Refill   levothyroxine (SYNTHROID) 137 MCG tablet Take 1 tablet (137 mcg total) by mouth daily. 90 tablet 3   omeprazole (PRILOSEC) 20 MG capsule Take 1 capsule (20 mg total) by mouth daily. 30 capsule 11   Semaglutide-Weight Management 0.5 MG/0.5ML SOAJ Inject 0.5 mg into the skin once a week.     No current facility-administered medications for this visit.    Patient confirms/reports the following allergies:  Allergies  Allergen Reactions   Penicillins Rash    Can tolerate Amoxicillin    No orders of the defined types were placed in this encounter.   AUTHORIZATION INFORMATION Primary Insurance: 1D#: Group #:  Secondary Insurance: 1D#: Group #:  SCHEDULE INFORMATION: Date: TBD Time: Location: [facility]

## 2023-05-10 NOTE — Progress Notes (Signed)
Hi Lunetta, Overall your labs look great.  Your electrolytes, kidney and liver function is normal.  Your blood counts are normal.  Your thyroid and A1c are normal.  No change needed in your thyroid medication at this time.  Your LDL (bad cholesterol) is slightly elevated.  Continue to monitor your intake of red meats, fatty foods, and increase good omega-3's with fish, nuts, beans and regular exercise.  We will recheck these in 1 year.  If you have questions please call the office

## 2023-05-11 ENCOUNTER — Ambulatory Visit
Admission: RE | Admit: 2023-05-11 | Discharge: 2023-05-11 | Disposition: A | Discharge status: Home / Self Care | Payer: Managed Care, Other (non HMO) | Source: Ambulatory Visit | Attending: Obstetrics & Gynecology | Admitting: Obstetrics & Gynecology

## 2023-05-11 DIAGNOSIS — N63 Unspecified lump in unspecified breast: Secondary | ICD-10-CM

## 2023-08-14 ENCOUNTER — Encounter (HOSPITAL_BASED_OUTPATIENT_CLINIC_OR_DEPARTMENT_OTHER): Payer: Self-pay | Admitting: Family Medicine

## 2023-08-14 ENCOUNTER — Other Ambulatory Visit (HOSPITAL_BASED_OUTPATIENT_CLINIC_OR_DEPARTMENT_OTHER): Payer: Self-pay

## 2023-08-14 ENCOUNTER — Ambulatory Visit (INDEPENDENT_AMBULATORY_CARE_PROVIDER_SITE_OTHER): Payer: Self-pay | Admitting: Family Medicine

## 2023-08-14 VITALS — BP 122/73 | HR 81 | Temp 97.9°F | Ht 65.0 in | Wt 160.2 lb

## 2023-08-14 DIAGNOSIS — J019 Acute sinusitis, unspecified: Secondary | ICD-10-CM

## 2023-08-14 DIAGNOSIS — B9689 Other specified bacterial agents as the cause of diseases classified elsewhere: Secondary | ICD-10-CM

## 2023-08-14 LAB — POCT INFLUENZA A/B
Influenza A, POC: NEGATIVE
Influenza B, POC: NEGATIVE

## 2023-08-14 LAB — POC COVID19 BINAXNOW: SARS Coronavirus 2 Ag: NEGATIVE

## 2023-08-14 MED ORDER — AMOXICILLIN-POT CLAVULANATE 875-125 MG PO TABS
1.0000 | ORAL_TABLET | Freq: Two times a day (BID) | ORAL | 0 refills | Status: AC
  Filled 2023-08-14: qty 14, 7d supply, fill #0

## 2023-08-14 MED ORDER — FLUCONAZOLE 150 MG PO TABS
150.0000 mg | ORAL_TABLET | Freq: Once | ORAL | 0 refills | Status: AC
  Filled 2023-08-14: qty 2, 2d supply, fill #0

## 2023-08-14 NOTE — Patient Instructions (Signed)
  Rest, drink plenty of fluids.  Tylenol for fever, body aches.   For cough: Take Mucinex DM or Robitussin-DM OTC.  Follow the instructions in the box.  For nasal congestion: -Use over-the-counter Flonase: 2 nasal sprays on each side of the nose in the morning until you feel better  -Use OTC Astepro 2 nasal sprays on each side of the nose twice daily until better  Avoid decongestants such as  Pseudoephedrine or phenylephrine   Take the antibiotic as prescribed    Call if not gradually better over the next  10 days  Call anytime if the symptoms are severe, you have high fever, short of breath, chest pain

## 2023-08-14 NOTE — Progress Notes (Signed)
Acute Care Office Visit  Subjective:   Nicole Perry 09-11-1976 08/14/2023  Chief Complaint  Patient presents with   Sinusitis    Pt started not feeling well about 6 days ago. Has had a cough, dull headache, head congestion and pain, and pain in her ear. Denies any fever that she knows of.    HPI: OTALGIA: Onset: Approx. 1 week ago  Location: Left ear radiating into her left jaw and sinuses   Sensation of fullness:  yes Ear discharge:  no URI symptoms:  yes, left sinus tenderness, congestion, fullness, headaches. She has intermittent nausea, excessive fatigue  Fever:   yes, low grade fever first 1-2 days Tinnitus: no Dizziness:  no Hearing loss:  no Headache:  yes Toothache:  yes, left upper molars  Rash:  no  Treatments tried: Mucinex Multisytem at night  Red Flags Recent trauma:  no PMH recurrent OM:  yes PMH prior ear surgery: no Tubes currently:  no Recent antibiotic usage (last 30 days): no Diabetes or Immunosuppresion:   no    The following portions of the patient's history were reviewed and updated as appropriate: past medical history, past surgical history, family history, social history, allergies, medications, and problem list.   Patient Active Problem List   Diagnosis Date Noted   Vitamin D deficiency 05/09/2023   Right leg injury 04/26/2016   Physical exam 03/10/2016   Maxillary sinusitis, acute 09/04/2015   Hypothyroidism 06/23/2015   Numbness of feet 04/14/2015   Insomnia 02/11/2015   Anxiety state 02/11/2015   Polyarthralgia 02/11/2015   Cough 10/20/2014   Atypical chest pain 10/15/2014   GERD (gastroesophageal reflux disease) 10/15/2014   Fatigue 10/15/2014   Dyspnea 10/08/2014   S/P hysterectomy 04/14/2014   Vaginal bleeding 05/10/2013   ADD (attention deficit disorder) 01/29/2013   Past Medical History:  Diagnosis Date   ADD (attention deficit disorder)    GERD (gastroesophageal reflux disease)    Thyroid disease     Past Surgical History:  Procedure Laterality Date   ABDOMINAL HYSTERECTOMY     APPENDECTOMY  1989   LAPAROSCOPIC ASSISTED VAGINAL HYSTERECTOMY N/A 04/14/2014   Procedure: LAPAROSCOPIC ASSISTED VAGINAL HYSTERECTOMY;  Surgeon: Mitchel Honour, DO;  Location: [facility];  Service: Gynecology;  Laterality: N/A;   SALPINGOOPHORECTOMY Bilateral 04/14/2014   Procedure: Bilateral salpingectomy ;  Surgeon: Mitchel Honour, DO;  Location: [facility];  Service: Gynecology;  Laterality: Bilateral;   Family History  Problem Relation Age of Onset   Multiple sclerosis Mother    Skin cancer Mother    Breast cancer Maternal Grandmother    Diabetes Maternal Grandmother    Other Maternal Grandmother        Blood Cancer   Irritable bowel syndrome Maternal Grandmother    Colon polyps Maternal Grandmother    High blood pressure Maternal Grandfather    Heart disease Maternal Grandfather    Colon polyps Maternal Grandfather    Heart attack Maternal Grandfather        in early 38s.    Prostate cancer Maternal Uncle    Celiac disease Daughter    Colon cancer Maternal Uncle        questionable   Outpatient Medications Prior to Visit  Medication Sig Dispense Refill   levothyroxine (SYNTHROID) 137 MCG tablet Take 1 tablet (137 mcg total) by mouth daily. 90 tablet 3   omeprazole (PRILOSEC) 20 MG capsule Take 1 capsule (20 mg total) by mouth daily. 30 capsule 11   Semaglutide-Weight  Management 0.5 MG/0.5ML SOAJ Inject 0.5 mg into the skin once a week.     No facility-administered medications prior to visit.   Allergies  Allergen Reactions   Penicillins Rash    Can tolerate Amoxicillin     ROS: A complete ROS was performed with pertinent positives/negatives noted in the HPI. The remainder of the ROS are negative.    Objective:   Today's Vitals   08/14/23 1056  BP: 122/73  Pulse: 81  Temp: 97.9 F (36.6 C)  TempSrc: Oral  SpO2: 100%  Weight: 160 lb 3.2 oz (72.7 kg)  Height: 5\' 5"  (1.651 m)    GENERAL:  Well-appearing, in NAD. Well nourished.  SKIN: Pink, warm and dry. No rash, lesion, ulceration, or ecchymoses.  Head: Normocephalic. NECK: Trachea midline. Full ROM w/o pain or tenderness. Mild cervical lymphadenopathy, left greater than right.  EARS: Right and Left Tympanic membranes are intact, left TM is red and injected, mildly bulging, without drainage. Right TM is injected, without bulging and without drainage.Marland Kitchen  EYES: Conjunctiva clear without exudates. EOMI, PERRL, no drainage present.  NOSE: Septum midline w/o deformity. Nares patent, mucosa pink and non-inflamed w/o drainage. Mild frontal sinus tenderness.  THROAT: Uvula midline. Oropharynx clear. Mucous membranes pink and moist.  RESPIRATORY: Chest wall symmetrical. Respirations even and non-labored. Breath sounds clear to auscultation bilaterally. Cough is dry and nonproductive.  CARDIAC: S1, S2 present, regular rate and rhythm without murmur or gallops. Peripheral pulses 2+ bilaterally.  MSK: Muscle tone and strength appropriate for age.  EXTREMITIES: Without clubbing, cyanosis, or edema.  NEUROLOGIC: No motor or sensory deficits. Steady, even gait. C2-C12 intact.  PSYCH/MENTAL STATUS: Alert, oriented x 3. Cooperative, appropriate mood and affect.    Results for orders placed or performed in visit on 08/14/23  POC COVID-19 BinaxNow  Result Value Ref Range   SARS Coronavirus 2 Ag Negative Negative  POCT Influenza A/B  Result Value Ref Range   Influenza A, POC Negative Negative   Influenza B, POC Negative Negative      Assessment & Plan:  1. Acute bacterial sinusitis Negative for Covid and Flu in office. Start Augmentin BID x 7 days for likely acute bacterial sinusitis . Use antihistamine such as Claritin, Zyrtec, or Allegra for nasal congestion. Use tylenol or ibuprofen for fever/chills. Pt to return in 48 hours if no improvement or worsening.   Meds ordered this encounter  Medications   amoxicillin-clavulanate (AUGMENTIN)  875-125 MG tablet    Sig: Take 1 tablet by mouth 2 (two) times daily for 7 days.    Dispense:  14 tablet    Refill:  0    Order Specific Question:   Supervising Provider    Answer:   DE [place], RAYMOND J [[id number]]   fluconazole (DIFLUCAN) 150 MG tablet    Sig: Take 1 tablet (150 mg total) by mouth once for 1 dose. May repeat after 3 days if needed.    Dispense:  2 tablet    Refill:  0    Order Specific Question:   Supervising Provider    Answer:   DE [place], RAYMOND J [[id number]]   Lab Orders         POC COVID-19 BinaxNow         POCT Influenza A/B     No images are attached to the encounter or orders placed in the encounter.  Return if symptoms worsen or fail to improve.    Patient to reach out to office if new, worrisome,  or unresolved symptoms arise or if no improvement in patient's condition. Patient verbalized understanding and is agreeable to treatment plan. All questions answered to patient's satisfaction.    Yolanda Manges, FNP

## 2023-08-15 ENCOUNTER — Encounter: Payer: Self-pay | Admitting: *Deleted

## 2023-09-18 ENCOUNTER — Encounter (HOSPITAL_BASED_OUTPATIENT_CLINIC_OR_DEPARTMENT_OTHER): Payer: Self-pay | Admitting: Family Medicine

## 2023-09-18 NOTE — Telephone Encounter (Signed)
Jon Gills, please see mychart message sent by pt and advise.

## 2024-05-09 ENCOUNTER — Encounter (HOSPITAL_BASED_OUTPATIENT_CLINIC_OR_DEPARTMENT_OTHER): Payer: Managed Care, Other (non HMO) | Admitting: Family Medicine

## 2024-05-15 ENCOUNTER — Other Ambulatory Visit (HOSPITAL_BASED_OUTPATIENT_CLINIC_OR_DEPARTMENT_OTHER): Payer: Self-pay | Admitting: Family Medicine

## 2024-05-25 ENCOUNTER — Other Ambulatory Visit (HOSPITAL_BASED_OUTPATIENT_CLINIC_OR_DEPARTMENT_OTHER): Payer: Self-pay | Admitting: Family Medicine

## 2024-07-25 ENCOUNTER — Encounter (HOSPITAL_BASED_OUTPATIENT_CLINIC_OR_DEPARTMENT_OTHER): Admitting: Family Medicine

## 2024-08-01 ENCOUNTER — Encounter (HOSPITAL_BASED_OUTPATIENT_CLINIC_OR_DEPARTMENT_OTHER): Payer: Self-pay | Admitting: Family Medicine

## 2024-08-01 ENCOUNTER — Other Ambulatory Visit (HOSPITAL_BASED_OUTPATIENT_CLINIC_OR_DEPARTMENT_OTHER): Payer: Self-pay | Admitting: Family Medicine

## 2024-08-01 ENCOUNTER — Ambulatory Visit (INDEPENDENT_AMBULATORY_CARE_PROVIDER_SITE_OTHER): Payer: Self-pay | Admitting: Family Medicine

## 2024-08-01 VITALS — BP 102/65 | HR 80 | Ht 65.0 in | Wt 161.0 lb

## 2024-08-01 DIAGNOSIS — E538 Deficiency of other specified B group vitamins: Secondary | ICD-10-CM

## 2024-08-01 DIAGNOSIS — Z0000 Encounter for general adult medical examination without abnormal findings: Secondary | ICD-10-CM

## 2024-08-01 DIAGNOSIS — E559 Vitamin D deficiency, unspecified: Secondary | ICD-10-CM

## 2024-08-01 DIAGNOSIS — E039 Hypothyroidism, unspecified: Secondary | ICD-10-CM

## 2024-08-01 DIAGNOSIS — Z1211 Encounter for screening for malignant neoplasm of colon: Secondary | ICD-10-CM

## 2024-08-01 DIAGNOSIS — Z13220 Encounter for screening for lipoid disorders: Secondary | ICD-10-CM

## 2024-08-01 DIAGNOSIS — E063 Autoimmune thyroiditis: Secondary | ICD-10-CM | POA: Insufficient documentation

## 2024-08-01 NOTE — Patient Instructions (Signed)

## 2024-08-01 NOTE — Progress Notes (Signed)
 Subjective:   Nicole Perry 11/15/1975  08/01/2024   CC: Chief Complaint  Patient presents with   Annual Exam    Pt is here today for her physical. Denies any concerns for today's visit.    HPI: Nicole Perry is a 48 y.o. female who presents for a routine health maintenance exam.  Labs collected at time of visit.   WEIGHT:  She stopped her GLP 1 in May/June 2025 and got down to 153lbs. She would like to continue compounded GLP 1 with micro-dosing to help maintain and get to goal weight of 155lbs. She tolerated Glp 1 well without adverse effects.   Wt Readings from Last 3 Encounters:  08/01/24 161 lb (73 kg)  08/14/23 160 lb 3.2 oz (72.7 kg)  05/09/23 170 lb 1.6 oz (77.2 kg)    HEALTH SCREENINGS: - Vision Screening: up to date - Dental Visits: Will schedule  - Pap smear: N/a TAH  - Breast Exam: Declined - STD Screening: Declined - Mammogram (40+): Will schedule with OBGYN  - Colonoscopy (45+): Ordered today  - Bone Density (65+ or under 65 with predisposing conditions): Not applicable  - Lung CA screening with low-dose CT:  Not applicable Adults age 44-80 who are current cigarette smokers or quit within the last 15 years. Must have 20 pack year history.   Depression and Anxiety Screen done today and results listed below:     08/01/2024    8:49 AM 08/14/2023   11:00 AM 05/09/2023    8:23 AM 03/21/2018    8:13 AM 06/21/2017    9:53 AM  Depression screen PHQ 2/9  Decreased Interest 0 0 0 0 2  Down, Depressed, Hopeless 0 0 0 0 1  PHQ - 2 Score 0 0 0 0 3  Altered sleeping 0  0 0 2  Tired, decreased energy 0  0 0 2  Change in appetite 0  0 0 0  Feeling bad or failure about yourself  0  0 0 0  Trouble concentrating 0  0 0 0  Moving slowly or fidgety/restless 0  0 0 0  Suicidal thoughts 0  0 0 0  PHQ-9 Score 0  0 0 7  Difficult doing work/chores Not difficult at all  Not difficult at all Not difficult at all Not difficult at all      08/01/2024    8:49 AM  05/09/2023    8:23 AM  GAD 7 : Generalized Anxiety Score  Nervous, Anxious, on Edge 0 0  Control/stop worrying 0 0  Worry too much - different things 0 0  Trouble relaxing 0 0  Restless 0 0  Easily annoyed or irritable 0 0  Afraid - awful might happen 0 0  Total GAD 7 Score 0 0  Anxiety Difficulty Not difficult at all Not difficult at all    IMMUNIZATIONS: - Tdap: Tetanus vaccination status reviewed: Declined. - HPV: Not applicable - Influenza: Refused - Pneumovax: Not applicable - Prevnar 20: Not applicable - Shingrix (50+): Recommended   Past medical history, surgical history, medications, allergies, family history and social history reviewed with patient today and changes made to appropriate areas of the chart.   Past Medical History:  Diagnosis Date   ADD (attention deficit disorder)    GERD (gastroesophageal reflux disease)    Thyroid  disease     Past Surgical History:  Procedure Laterality Date   ABDOMINAL HYSTERECTOMY     APPENDECTOMY  1989   LAPAROSCOPIC ASSISTED VAGINAL  HYSTERECTOMY N/A 04/14/2014   Procedure: LAPAROSCOPIC ASSISTED VAGINAL HYSTERECTOMY;  Surgeon: Duwaine Blumenthal, DO;  Location: [facility];  Service: Gynecology;  Laterality: N/A;   SALPINGOOPHORECTOMY Bilateral 04/14/2014   Procedure: Bilateral salpingectomy ;  Surgeon: Duwaine Blumenthal, DO;  Location: [facility];  Service: Gynecology;  Laterality: Bilateral;    Current Outpatient Medications on File Prior to Visit  Medication Sig   levothyroxine  (SYNTHROID ) 137 MCG tablet TAKE 1 TABLET BY MOUTH EVERY DAY   omeprazole  (PRILOSEC) 20 MG capsule TAKE 1 CAPSULE BY MOUTH EVERY DAY   No current facility-administered medications on file prior to visit.    Allergies  Allergen Reactions   Penicillins Rash    Can tolerate Amoxicillin      Social History   Socioeconomic History   Marital status: Married    Spouse name: Not on file   Number of children: 2   Years of education: Not on file   Highest education  level: Not on file  Occupational History   Occupation: Inventory Control    Employer: [employer]  Tobacco Use   Smoking status: Former    Current packs/day: 0.00    Average packs/day: 0.3 packs/day for 15.0 years (3.8 ttl pk-yrs)    Types: Cigarettes    Start date: 04/15/1995    Quit date: 04/14/2010    Years since quitting: 14.3   Smokeless tobacco: Never  Vaping Use   Vaping status: Never Used  Substance and Sexual Activity   Alcohol use: Yes    Alcohol/week: 0.0 standard drinks of alcohol    Comment: Socially   Drug use: No   Sexual activity: Not on file  Other Topics Concern   Not on file  Social History Narrative   Not on file   Social Drivers of Health   Financial Resource Strain: Low Risk  (08/14/2023)   Overall Financial Resource Strain (CARDIA)    Difficulty of Paying Living Expenses: Not hard at all  Food Insecurity: No Food Insecurity (04/08/2022)   [facility]   Hunger Vital Sign    Within the past 12 months, you worried that your food would run out before you got the money to buy more.: Never true    Within the past 12 months, the food you bought just didn't last and you didn't have money to get more.: Never true  Transportation Needs: No Transportation Needs (08/14/2023)   PRAPARE - Administrator, Civil Service (Medical): No    Lack of Transportation (Non-Medical): No  Physical Activity: Insufficiently Active (08/14/2023)   Exercise Vital Sign    Days of Exercise per Week: 2 days    Minutes of Exercise per Session: 60 min  Stress: No Stress Concern Present (08/14/2023)   [employer] of Occupational Health - Occupational Stress Questionnaire    Feeling of Stress : Not at all  Social Connections: Moderately Isolated (08/14/2023)   Social Connection and Isolation Panel    Frequency of Communication with Friends and Family: More than three times a week    Frequency of Social Gatherings with Friends and Family: More than  three times a week    Attends Religious Services: Never    Database Administrator or Organizations: No    Attends Banker Meetings: Never    Marital Status: Married  Catering Manager Violence: Not At Risk (08/14/2023)   Humiliation, Afraid, Rape, and Kick questionnaire    Fear of Current or Ex-Partner: No    Emotionally Abused:  No    Physically Abused: No    Sexually Abused: No   Social History   Tobacco Use  Smoking Status Former   Current packs/day: 0.00   Average packs/day: 0.3 packs/day for 15.0 years (3.8 ttl pk-yrs)   Types: Cigarettes   Start date: 04/15/1995   Quit date: 04/14/2010   Years since quitting: 14.3  Smokeless Tobacco Never   Social History   Substance and Sexual Activity  Alcohol Use Yes   Alcohol/week: 0.0 standard drinks of alcohol   Comment: Socially    Family History  Problem Relation Age of Onset   Multiple sclerosis Mother    Skin cancer Mother    Breast cancer Maternal Grandmother    Diabetes Maternal Grandmother    Other Maternal Grandmother        Blood Cancer   Irritable bowel syndrome Maternal Grandmother    Colon polyps Maternal Grandmother    High blood pressure Maternal Grandfather    Heart disease Maternal Grandfather    Colon polyps Maternal Grandfather    Heart attack Maternal Grandfather        in early 35s.    Prostate cancer Maternal Uncle    Celiac disease Daughter    Colon cancer Maternal Uncle        questionable     ROS: Denies fever, fatigue, unexplained weight loss/gain, chest pain, SHOB, and palpitations. Denies neurological deficits, gastrointestinal or genitourinary complaints, and skin changes.   Objective:   Today's Vitals   08/01/24 0847  BP: 102/65  Pulse: 80  SpO2: 100%  Weight: 161 lb (73 kg)  Height: 5' 5 (1.651 m)    GENERAL APPEARANCE: Well-appearing, in NAD. Well nourished.  SKIN: Pink, warm and dry. Turgor normal. No rash, lesion, ulceration, or ecchymoses. Hair evenly  distributed.  HEENT: HEAD: Normocephalic.  EYES: PERRLA. EOMI. Lids intact w/o defect. Sclera white, Conjunctiva pink w/o exudate.  EARS: External ear w/o redness, swelling, masses or lesions. EAC clear. TM's intact, translucent w/o bulging, appropriate landmarks visualized. Appropriate acuity to conversational tones.  NOSE: Septum midline w/o deformity. Nares patent, mucosa pink and non-inflamed w/o drainage. No sinus tenderness.  THROAT: Uvula midline. Oropharynx clear. Tonsils non-inflamed w/o exudate. Oral mucosa pink and moist.  NECK: Supple, Trachea midline. Full ROM w/o pain or tenderness. No lymphadenopathy. Thyroid  non-tender w/o enlargement or palpable masses.  RESPIRATORY: Chest wall symmetrical w/o masses. Respirations even and non-labored. Breath sounds clear to auscultation bilaterally. No wheezes, rales, rhonchi, or crackles. CARDIAC: S1, S2 present, regular rate and rhythm. No gallops, murmurs, rubs, or clicks. PMI w/o lifts, heaves, or thrills. No carotid bruits. Capillary refill <2 seconds. Peripheral pulses 2+ bilaterally. GI: Abdomen soft w/o distention. Normoactive bowel sounds. No palpable masses or tenderness. No guarding or rebound tenderness. Liver and spleen w/o tenderness or enlargement. No CVA tenderness.  MSK: Muscle tone and strength appropriate for age, w/o atrophy or abnormal movement.  EXTREMITIES: Active ROM intact, w/o tenderness, crepitus, or contracture. No obvious joint deformities or effusions. No clubbing, edema, or cyanosis.  NEUROLOGIC: CN's II-XII intact. Motor strength symmetrical with no obvious weakness. No sensory deficits. DTR's 2+ symmetric bilaterally. Steady, even gait.  PSYCH/MENTAL STATUS: Alert, oriented x 3. Cooperative, appropriate mood and affect.     Assessment & Plan:  1. Screening for colon cancer (Primary) - Ambulatory referral to Gastroenterology  2. Annual physical exam Discussed preventative screenings, vaccines, and healthy  lifestyle with patient. She will schedule her mammogram with her OBGYN. Shingrix, Tdap and  Flu vaccines recommended.   - CBC with Differential/Platelet - Comprehensive metabolic panel with GFR - Hemoglobin A1c  3. Acquired hypothyroidism Stable. Will check TSH with labs today.  - TSH  4. Screening for lipid disorders - Lipid panel  5. Vitamin D  deficiency Stable previously. Will check Vitamin D  with yearly labs today. Continue supplementation.  - VITAMIN D  25 Hydroxy (Vit-D Deficiency, Fractures)  6. B12 deficiency Stable previously. Will check with yearly labs and continue supplementation.  - Vitamin B12   Orders Placed This Encounter  Procedures   CBC with Differential/Platelet   Comprehensive metabolic panel with GFR   Lipid panel   TSH   Hemoglobin A1c   VITAMIN D  25 Hydroxy (Vit-D Deficiency, Fractures)   Vitamin B12   Ambulatory referral to Gastroenterology    Referral Priority:   Routine    Referral Type:   Consultation    Referral Reason:   Specialty Services Required    Number of Visits Requested:   1    PATIENT COUNSELING:  - Encouraged a healthy well-balanced diet. Patient may adjust caloric intake to maintain or achieve ideal body weight. May reduce intake of dietary saturated fat and total fat and have adequate dietary potassium and calcium preferably from fresh fruits, vegetables, and low-fat dairy products.   - Advised to avoid cigarette smoking. - Discussed with the patient that most people either abstain from alcohol or drink within safe limits (<=14/week and <=4 drinks/occasion for males, <=7/weeks and <= 3 drinks/occasion for females) and that the risk for alcohol disorders and other health effects rises proportionally with the number of drinks per week and how often a drinker exceeds daily limits. - Discussed cessation/primary prevention of drug use and availability of treatment for abuse.  - Discussed sexually transmitted diseases, avoidance of  unintended pregnancy and contraceptive alternatives.  - Stressed the importance of regular exercise - Injury prevention: Discussed safety belts, safety helmets, smoke detector, smoking near bedding or upholstery.  - Dental health: Discussed importance of regular tooth brushing, flossing, and dental visits.   NEXT PREVENTATIVE PHYSICAL DUE IN 1 YEAR.  Return in about 1 year (around 08/01/2025) for ANNUAL PHYSICAL.  Patient to reach out to office if new, worrisome, or unresolved symptoms arise or if no improvement in patient's condition. Patient verbalized understanding and is agreeable to treatment plan. All questions answered to patient's satisfaction.    Thersia Schuyler Stark, [place]

## 2024-08-02 ENCOUNTER — Ambulatory Visit (HOSPITAL_BASED_OUTPATIENT_CLINIC_OR_DEPARTMENT_OTHER): Payer: Self-pay | Admitting: Family Medicine

## 2024-08-02 LAB — CBC WITH DIFFERENTIAL/PLATELET
Basophils Absolute: 0.1 x10E3/uL (ref 0.0–0.2)
Basos: 1 %
EOS (ABSOLUTE): 0.1 x10E3/uL (ref 0.0–0.4)
Eos: 1 %
Hematocrit: 42.8 % (ref 34.0–46.6)
Hemoglobin: 14 g/dL (ref 11.1–15.9)
Immature Grans (Abs): 0 x10E3/uL (ref 0.0–0.1)
Immature Granulocytes: 0 %
Lymphocytes Absolute: 1.6 x10E3/uL (ref 0.7–3.1)
Lymphs: 26 %
MCH: 30.8 pg (ref 26.6–33.0)
MCHC: 32.7 g/dL (ref 31.5–35.7)
MCV: 94 fL (ref 79–97)
Monocytes Absolute: 0.4 x10E3/uL (ref 0.1–0.9)
Monocytes: 6 %
Neutrophils Absolute: 4 x10E3/uL (ref 1.4–7.0)
Neutrophils: 66 %
Platelets: 297 x10E3/uL (ref 150–450)
RBC: 4.54 x10E6/uL (ref 3.77–5.28)
RDW: 12 % (ref 11.7–15.4)
WBC: 6 x10E3/uL (ref 3.4–10.8)

## 2024-08-02 LAB — LIPID PANEL
Chol/HDL Ratio: 3.2 ratio (ref 0.0–4.4)
Cholesterol, Total: 209 mg/dL — ABNORMAL HIGH (ref 100–199)
HDL: 66 mg/dL (ref >39)
LDL Chol Calc (NIH): 133 mg/dL — ABNORMAL HIGH (ref 0–99)
Triglycerides: 58 mg/dL (ref 0–149)
VLDL Cholesterol Cal: 10 mg/dL (ref 5–40)

## 2024-08-02 LAB — COMPREHENSIVE METABOLIC PANEL WITH GFR
ALT: 17 IU/L (ref 0–32)
AST: 14 IU/L (ref 0–40)
Albumin: 4.7 g/dL (ref 3.9–4.9)
Alkaline Phosphatase: 72 IU/L (ref 41–116)
BUN/Creatinine Ratio: 16 (ref 9–23)
BUN: 11 mg/dL (ref 6–24)
Bilirubin Total: 0.4 mg/dL (ref 0.0–1.2)
CO2: 22 mmol/L (ref 20–29)
Calcium: 9.3 mg/dL (ref 8.7–10.2)
Chloride: 102 mmol/L (ref 96–106)
Creatinine, Ser: 0.67 mg/dL (ref 0.57–1.00)
Globulin, Total: 2.3 g/dL (ref 1.5–4.5)
Glucose: 82 mg/dL (ref 70–99)
Potassium: 4.7 mmol/L (ref 3.5–5.2)
Sodium: 139 mmol/L (ref 134–144)
Total Protein: 7 g/dL (ref 6.0–8.5)
eGFR: 106 mL/min/1.73 (ref >59)

## 2024-08-02 LAB — VITAMIN D 25 HYDROXY (VIT D DEFICIENCY, FRACTURES): Vit D, 25-Hydroxy: 28.2 ng/mL — ABNORMAL LOW (ref 30.0–100.0)

## 2024-08-02 LAB — HEMOGLOBIN A1C
Est. average glucose Bld gHb Est-mCnc: 100 mg/dL
Hgb A1c MFr Bld: 5.1 % (ref 4.8–5.6)

## 2024-08-02 LAB — TSH: TSH: 3.26 u[IU]/mL (ref 0.450–4.500)

## 2024-08-02 LAB — VITAMIN B12: Vitamin B-12: 574 pg/mL (ref 232–1245)

## 2024-08-02 NOTE — Progress Notes (Signed)
 Hi Nicole Perry, Your electrolytes, kidney, and liver function are stable.  Your thyroid  function is within normal range.  A1c is outside of the prediabetic range.  Your B12 and blood counts are stable without any signs of anemia.  Your vitamin D  was slightly low.  Please continue supplementation as discussed.  Your total cholesterol and LDL were slightly elevated.  Please continue to monitor heart healthy diet with regular exercise to keep this well-controlled.

## 2024-08-12 NOTE — Telephone Encounter (Signed)
 Please see message sent by pt and advise.

## 2024-10-29 ENCOUNTER — Encounter (HOSPITAL_BASED_OUTPATIENT_CLINIC_OR_DEPARTMENT_OTHER): Payer: Self-pay | Admitting: Family Medicine
